# Patient Record
Sex: Female | Born: 1980 | Race: Black or African American | Hispanic: No | Marital: Married | Smoking: Current every day smoker
Health system: Southern US, Community
[De-identification: ages and names within clinical notes are randomized; demographics above are authoritative.]

## PROBLEM LIST (undated history)

## (undated) HISTORY — PX: DILATION AND CURETTAGE OF UTERUS: SHX78

---

## 2017-08-09 ENCOUNTER — Encounter (HOSPITAL_BASED_OUTPATIENT_CLINIC_OR_DEPARTMENT_OTHER): Payer: Self-pay | Admitting: *Deleted

## 2017-08-09 ENCOUNTER — Other Ambulatory Visit: Payer: Self-pay

## 2017-08-09 ENCOUNTER — Emergency Department (HOSPITAL_BASED_OUTPATIENT_CLINIC_OR_DEPARTMENT_OTHER)
Admission: EM | Admit: 2017-08-09 | Discharge: 2017-08-09 | Disposition: A | Discharge status: Home / Self Care | Payer: Self-pay | Attending: Emergency Medicine | Admitting: Emergency Medicine

## 2017-08-09 DIAGNOSIS — Z7982 Long term (current) use of aspirin: Secondary | ICD-10-CM | POA: Insufficient documentation

## 2017-08-09 DIAGNOSIS — K029 Dental caries, unspecified: Secondary | ICD-10-CM | POA: Insufficient documentation

## 2017-08-09 DIAGNOSIS — F17210 Nicotine dependence, cigarettes, uncomplicated: Secondary | ICD-10-CM | POA: Insufficient documentation

## 2017-08-09 DIAGNOSIS — K0889 Other specified disorders of teeth and supporting structures: Secondary | ICD-10-CM

## 2017-08-09 MED ORDER — PENICILLIN V POTASSIUM 500 MG PO TABS: 500.0000 mg | ORAL_TABLET | Freq: Four times a day (QID) | ORAL | 0 refills | Status: DC

## 2017-08-09 MED ORDER — HYDROCODONE-ACETAMINOPHEN 5-325 MG PO TABS: 1.0000 | ORAL_TABLET | Freq: Four times a day (QID) | ORAL | 0 refills | Status: DC | PRN

## 2017-08-09 MED ORDER — PENICILLIN V POTASSIUM 250 MG PO TABS
500.0000 mg | ORAL_TABLET | Freq: Once | ORAL | Status: AC
  Administered 2017-08-09: 500 mg via ORAL
  Filled 2017-08-09: qty 2

## 2017-08-09 MED ORDER — HYDROMORPHONE HCL 1 MG/ML IJ SOLN
2.0000 mg | Freq: Once | INTRAMUSCULAR | Status: AC
  Administered 2017-08-09: 2 mg via INTRAMUSCULAR
  Filled 2017-08-09: qty 2

## 2017-08-09 NOTE — ED Provider Notes (Signed)
[facility] Provider Note: Lowella Dell, MD, FACEP  CSN: [id number] MRN: [id number] ARRIVAL: 08/09/17 at 0018 ROOM: MH04/[facility]04   CHIEF COMPLAINT  Dental Pain   HISTORY OF PRESENT ILLNESS  08/09/17 1:39 AM Mary Kidd is a 37 y.o. female with a 3-day history of pain in her left upper molar region.  She associates it primarily with her left upper third molar but also with her left upper first molar and missing or severely carious second molar.  Pain is worse with eating or drinking.  She refuses a dental block due to fear of needles.    History reviewed. No pertinent past medical history.  Past Surgical History:  Procedure Laterality Date  . DILATION AND CURETTAGE OF UTERUS      History reviewed. No pertinent family history.  Social History   Tobacco Use  . Smoking status: Current Every Day Smoker    Packs/day: 0.50    Types: Cigarettes  . Smokeless tobacco: Never Used  Substance Use Topics  . Alcohol use: Yes    Comment: soc  . Drug use: Never    Prior to Admission medications   Medication Sig Start Date End Date Taking? Authorizing Provider  Aspirin-Acetaminophen-Caffeine (GOODY HEADACHE PO) Take by mouth.   Yes [provider]  traMADol (ULTRAM) 50 MG tablet Take 100 mg by mouth every 6 (six) hours as needed.   Yes [provider]    Allergies Patient has no known allergies.   REVIEW OF SYSTEMS  Negative except as noted here or in the History of Present Illness.   PHYSICAL EXAMINATION  Initial Vital Signs Blood pressure (!) 153/93, pulse 66, temperature 98.7 F (37.1 C), resp. rate 16, height  (1.397 m), weight 70.3 kg (155 lb), last menstrual period 08/04/2017, SpO2 99 %.  Examination General: Well-developed, well-nourished female in no acute distress; appearance consistent with age of record HENT: normocephalic; atraumatic; multiple missing and carious teeth: Tenderness of left upper molars, notably the third molar  with missing or severely carious second molar Eyes: pupils equal, round and reactive to light; extraocular muscles intact Neck: supple; no lymphadenopathy Heart: regular rate and rhythm Lungs: clear to auscultation bilaterally Abdomen: soft; nondistended; nontender Extremities: No deformity; full range of motion; pulses normal Neurologic: Awake, alert and oriented; motor function intact in all extremities and symmetric; no facial droop Skin: Warm and dry Psychiatric: Normal mood and affect   RESULTS  Summary of this visit's results, reviewed by myself:   EKG Interpretation  Date/Time:    Ventricular Rate:    PR Interval:    QRS Duration:   QT Interval:    QTC Calculation:   R Axis:     Text Interpretation:        Laboratory Studies: No results found for this or any previous visit (from the past 24 hour(s)). Imaging Studies: No results found.  ED COURSE and MDM  Nursing notes and initial vitals signs, including pulse oximetry, reviewed.  Vitals:   08/09/17 0021 08/09/17 0022 08/09/17 0024  BP:   (!) 153/93  Pulse: 66    Resp: 16    Temp: 98.7 F (37.1 C)    SpO2: 99%    Weight:  70.3 kg (155 lb)   Height:   (1.397 m)    We will treat patient's pain and place her on penicillin.  We will refer to the dentist on call for definitive treatment.  Consultation with the [facility] state controlled substances database reveals  the patient has received 2 prescriptions for tramadol in the last 2 years.   PROCEDURES    ED DIAGNOSES     ICD-10-CM   1. Tooth pain K08.89   2. Dental caries K02.9        Giordano Getman, MD 08/09/17 [phone]

## 2017-08-09 NOTE — ED Triage Notes (Signed)
Pt c/o dental pain x 3 days.

## 2017-09-23 ENCOUNTER — Emergency Department (HOSPITAL_BASED_OUTPATIENT_CLINIC_OR_DEPARTMENT_OTHER)
Admission: EM | Admit: 2017-09-23 | Discharge: 2017-09-23 | Disposition: A | Discharge status: Home / Self Care | Payer: Self-pay | Attending: Emergency Medicine | Admitting: Emergency Medicine

## 2017-09-23 ENCOUNTER — Other Ambulatory Visit: Payer: Self-pay

## 2017-09-23 DIAGNOSIS — F17210 Nicotine dependence, cigarettes, uncomplicated: Secondary | ICD-10-CM | POA: Insufficient documentation

## 2017-09-23 DIAGNOSIS — K0889 Other specified disorders of teeth and supporting structures: Secondary | ICD-10-CM | POA: Insufficient documentation

## 2017-09-23 MED ORDER — OXYCODONE-ACETAMINOPHEN 5-325 MG PO TABS: 1.0000 | ORAL_TABLET | Freq: Four times a day (QID) | ORAL | 0 refills | Status: DC | PRN

## 2017-09-23 MED ORDER — PENICILLIN V POTASSIUM 500 MG PO TABS: 500.0000 mg | ORAL_TABLET | Freq: Four times a day (QID) | ORAL | 0 refills | Status: AC

## 2017-09-23 MED ORDER — NAPROXEN 250 MG PO TABS
500.0000 mg | ORAL_TABLET | Freq: Once | ORAL | Status: AC
  Administered 2017-09-23: 500 mg via ORAL
  Filled 2017-09-23: qty 2

## 2017-09-23 MED ORDER — OXYCODONE-ACETAMINOPHEN 5-325 MG PO TABS
1.0000 | ORAL_TABLET | Freq: Once | ORAL | Status: AC
  Administered 2017-09-23: 1 via ORAL
  Filled 2017-09-23: qty 1

## 2017-09-23 NOTE — ED Provider Notes (Signed)
[facility] EMERGENCY DEPARTMENT Provider Note   CSN: [id number] Arrival date & time: 09/23/17  2130     History   Chief Complaint Chief Complaint  Patient presents with  . Dental Pain    HPI Mary Kidd is a 37 y.o. female.  HPI  Mary Kidd is a 37yo female with no significant past medical history who presents to the emergency department for evaluation of dental pain.  Patient reports that she broke her right bottom premolar 2 months ago.  She developed pain over this tooth 4 days ago.  She states that pain is severe, constant and feels throbbing in nature.  Pain radiates up to her ear.  She reports pain is worsened with cold liquids, cold air or chewing on the right side.  She has tried taking Goody's powder and was seen at [facility] ER last night where she was given a Toradol shot.  She states that despite these efforts her pain is not improved.  She does not have a regular dentist.  She reports some facial swelling on the right side.  She denies trismus, trouble breathing, trouble swallowing, neck pain, fever, chills.  No past medical history on file.  There are no active problems to display for this patient.   Past Surgical History:  Procedure Laterality Date  . DILATION AND CURETTAGE OF UTERUS       OB History   None      Home Medications    Prior to Admission medications   Medication Sig Start Date End Date Taking? Authorizing Provider  HYDROcodone-acetaminophen (NORCO) 5-325 MG tablet Take 1 tablet by mouth every 6 (six) hours as needed (for pain). 08/09/17   Molpus, John, MD  penicillin v potassium (VEETID) 500 MG tablet Take 1 tablet (500 mg total) by mouth 4 (four) times daily. 08/09/17   Molpus, John, MD    Family History No family history on file.  Social History Social History   Tobacco Use  . Smoking status: Current Every Day Smoker    Packs/day: 0.50    Types: Cigarettes  . Smokeless tobacco: Never Used  Substance Use Topics    . Alcohol use: Yes    Comment: soc  . Drug use: Never     Allergies   Patient has no known allergies.   Review of Systems Review of Systems  Constitutional: Negative for chills and fever.  HENT: Positive for dental problem and facial swelling. Negative for trouble swallowing.   Respiratory: Negative for shortness of breath.   Neurological: Negative for headaches.     Physical Exam Updated Vital Signs BP 120/80 (BP Location: Right Arm)   Pulse 79   Temp 99.5 F (37.5 C) (Oral)   Resp 18   Ht 4\' 7"  (1.397 m)   Wt 70.8 kg (156 lb)   LMP 09/23/2017   SpO2 99%   BMI 36.26 kg/m   Physical Exam  Constitutional: She appears well-developed and well-nourished. No distress.  Nontoxic-appearing.  HENT:  Head: Normocephalic and atraumatic.  Mouth/Throat:    Dental cavities and poor oral dentition noted. Several missing teeth. Right lower premolar chipped with associated overlying pain. No abscess noted. Midline uvula. No trismus. OP moist and clear. No oropharyngeal erythema or edema. Neck supple with no tenderness. No facial edema.  Eyes: Right eye exhibits no discharge. Left eye exhibits no discharge.  Neck: Normal range of motion. Neck supple.  FROM, supple.  Pulmonary/Chest: Effort normal. No respiratory distress.  Neurological: She is  alert. Coordination normal.  Skin: She is not diaphoretic.  Psychiatric: She has a normal mood and affect. Her behavior is normal.  Nursing note and vitals reviewed.   ED Treatments / Results  Labs (all labs ordered are listed, but only abnormal results are displayed) Labs Reviewed - No data to display  EKG None  Radiology No results found.  Procedures Procedures (including critical care time)  Medications Ordered in ED Medications  oxyCODONE-acetaminophen (PERCOCET/ROXICET) 5-325 MG per tablet 1 tablet (1 tablet Oral Given 09/23/17 2332)  naproxen (NAPROSYN) tablet 500 mg (500 mg Oral Given 09/23/17 2332)     Initial  Impression / Assessment and Plan / ED Course  I have reviewed the triage vital signs and the nursing notes.  Pertinent labs & imaging results that were available during my care of the patient were reviewed by me and considered in my medical decision making (see chart for details).     Patient with toothache over chipped tooth.  No gross abscess.  Exam unconcerning for Ludwig's angina or spread of infection.  Will treat with penicillin, anti-inflammatories medicine and short course of percocet given patient has been seen for this same complaint yesterday and has not had improvement. Urged patient to follow-up with dentist and have provided her dental resource guide with her discharge paperwork. Counseled her on reasons to return to the ED and she agrees and voices understanding.     Final Clinical Impressions(s) / ED Diagnoses   Final diagnoses:  Pain, dental    ED Discharge Orders        Ordered    penicillin v potassium (VEETID) 500 MG tablet  4 times daily     09/23/17 2337    oxyCODONE-acetaminophen (PERCOCET/ROXICET) 5-325 MG tablet  Every 6 hours PRN     09/23/17 2337       Kellie Shropshire, PA-C 09/23/17 2352    Loren Racer, MD 09/25/17 2311

## 2017-09-23 NOTE — ED Triage Notes (Addendum)
Right sided dental pain with swelling x 4 days.  [facility] given a toradol shot 'that didn't do no good'

## 2017-09-23 NOTE — ED Notes (Signed)
States that she has a rx for antibiotics given to her last night-states that she hasnt been able to get it filled because of her pain

## 2017-09-23 NOTE — Discharge Instructions (Signed)
Use the dental resource guide attached to contact a dentist to have your tooth pain evaluated and managed.   Take 800mg  ibuprofen every 6hrs. I have also written you a prescription for penicillin which is an antibiotic. Please take this four times a day for a week until all antibiotic is completely gone. You have a prescription for oxycodone. This medicine can make you drowsy so please do not drive or work while taking it.   Return to the ER if you have any new or worsening symptoms like trouble opening your mouth, fever, neck swelling or trouble moving your neck, trouble swallowing or trouble breathing.

## 2017-09-23 NOTE — ED Notes (Signed)
Pt reports she was given bactrim and wants penicillin.

## 2017-11-14 ENCOUNTER — Other Ambulatory Visit: Payer: Self-pay

## 2017-11-14 ENCOUNTER — Encounter (HOSPITAL_BASED_OUTPATIENT_CLINIC_OR_DEPARTMENT_OTHER): Payer: Self-pay | Admitting: *Deleted

## 2017-11-14 ENCOUNTER — Emergency Department (HOSPITAL_BASED_OUTPATIENT_CLINIC_OR_DEPARTMENT_OTHER)
Admission: EM | Admit: 2017-11-14 | Discharge: 2017-11-14 | Disposition: A | Discharge status: Home / Self Care | Payer: Self-pay | Attending: Emergency Medicine | Admitting: Emergency Medicine

## 2017-11-14 DIAGNOSIS — F17210 Nicotine dependence, cigarettes, uncomplicated: Secondary | ICD-10-CM | POA: Insufficient documentation

## 2017-11-14 DIAGNOSIS — K0889 Other specified disorders of teeth and supporting structures: Secondary | ICD-10-CM | POA: Insufficient documentation

## 2017-11-14 MED ORDER — KETOROLAC TROMETHAMINE 15 MG/ML IJ SOLN
15.0000 mg | Freq: Once | INTRAMUSCULAR | Status: AC
  Administered 2017-11-14: 15 mg via INTRAMUSCULAR
  Filled 2017-11-14: qty 1

## 2017-11-14 MED ORDER — PENICILLIN V POTASSIUM 500 MG PO TABS: 500.0000 mg | ORAL_TABLET | Freq: Four times a day (QID) | ORAL | 0 refills | Status: AC

## 2017-11-14 MED ORDER — NAPROXEN 500 MG PO TABS: 500.0000 mg | ORAL_TABLET | Freq: Two times a day (BID) | ORAL | 0 refills | Status: DC

## 2017-11-14 MED ORDER — LIDOCAINE VISCOUS HCL 2 % MT SOLN: 15.0000 mL | OROMUCOSAL | 0 refills | Status: DC | PRN

## 2017-11-14 NOTE — ED Provider Notes (Signed)
[facility] EMERGENCY DEPARTMENT Provider Note   CSN: [id number] Arrival date & time: 11/14/17  2130     History   Chief Complaint Chief Complaint  Patient presents with  . Dental Pain    HPI Mary Kidd is a 37 y.o. female presenting for evaluation of dental pain.  Patient states that she started to develop pain of her right upper and right lower tooth yesterday.  She reports she is taking Goody powders without improvement of her symptoms.  She had 2 leftover penicillin pills, she took this morning without improvement.  She has not taken anything else for her symptoms.  She denies trauma or injury.  Denies fevers, chills, difficulty breathing, difficulty swallowing.  She has an appointment with the dentist for October 1.  He has had issues with these teeth before.  She reports her pain is usually well controlled with a Toradol shot and antibiotics at home.  She denies any medical problems, states she takes no medications daily. Patient states she has not been on antibiotics recently other than the 2 pills this morning.  HPI  History reviewed. No pertinent past medical history.  There are no active problems to display for this patient.   Past Surgical History:  Procedure Laterality Date  . DILATION AND CURETTAGE OF UTERUS       OB History   None      Home Medications    Prior to Admission medications   Medication Sig Start Date End Date Taking? Authorizing Provider  lidocaine (XYLOCAINE) 2 % solution Use as directed 15 mLs in the mouth or throat as needed for mouth pain. 11/14/17   Jaiyla Granados, PA-C  naproxen (NAPROSYN) 500 MG tablet Take 1 tablet (500 mg total) by mouth 2 (two) times daily with a meal. 11/14/17   Khoa Opdahl, PA-C  penicillin v potassium (VEETID) 500 MG tablet Take 1 tablet (500 mg total) by mouth 4 (four) times daily for 7 days. 11/14/17 11/21/17  Sully Dyment, PA-C    Family History History reviewed. No pertinent family  history.  Social History Social History   Tobacco Use  . Smoking status: Current Every Day Smoker    Packs/day: 0.50    Types: Cigarettes  . Smokeless tobacco: Never Used  Substance Use Topics  . Alcohol use: Yes    Comment: soc  . Drug use: Never     Allergies   Patient has no known allergies.   Review of Systems Review of Systems  Constitutional: Negative for fever.  HENT: Positive for dental problem.      Physical Exam Updated Vital Signs BP (!) 137/98   Pulse 85   Temp 98.5 F (36.9 C) (Oral)   Resp 16   Ht 4\' 8"  (1.422 m)   Wt 68 kg   LMP 11/10/2017   BMI 33.63 kg/m   Physical Exam  Constitutional: She is oriented to person, place, and time. She appears well-developed and well-nourished. No distress.  HENT:  Head: Normocephalic and atraumatic.  Mouth/Throat: Uvula is midline. No trismus in the jaw. Abnormal dentition. Dental caries present.    Cracked right lower tooth with surrounding gum erythema and edema.  Tenderness of this tooth.  No obvious deformity of the right upper tooth.  Overall poor dentition.  OP clear without tonsillar swelling or exudate.  Uvula midline with equal palate rise.  No facial swelling.  No trismus.  Handling secretions easily.  Eyes: EOM are normal.  Neck: Normal range of motion.  Pulmonary/Chest: Effort normal.  Abdominal: She exhibits no distension.  Musculoskeletal: Normal range of motion.  Neurological: She is alert and oriented to person, place, and time.  Skin: Skin is warm. No rash noted.  Psychiatric: She has a normal mood and affect.  Nursing note and vitals reviewed.    ED Treatments / Results  Labs (all labs ordered are listed, but only abnormal results are displayed) Labs Reviewed - No data to display  EKG None  Radiology No results found.  Procedures Procedures (including critical care time)  Medications Ordered in ED Medications  ketorolac (TORADOL) 15 MG/ML injection 15 mg (15 mg  Intramuscular Given 11/14/17 2216)     Initial Impression / Assessment and Plan / ED Course  I have reviewed the triage vital signs and the nursing notes.  Pertinent labs & imaging results that were available during my care of the patient were reviewed by me and considered in my medical decision making (see chart for details).     Patient presenting for evaluation of dental pain.  Physical exam reassuring, she is afebrile not tachycardic.  Appears nontoxic.  No obvious dental abscess.  No sign of Ludwig's.  Tenderness of the right lower tooth, which is cracked with surrounding gum erythema and inflammation.  No obvious abnormality noted of the right upper tooth.  Will give Toradol shot, treat with NSAIDs, antibiotics, and viscous lidocaine.  Patient encouraged to follow-up with her dentist.  At this time, patient appears safe for discharge.  Return precautions given.  Patient states she understands and agrees to plan.  Final Clinical Impressions(s) / ED Diagnoses   Final diagnoses:  Pain, dental    ED Discharge Orders         Ordered    naproxen (NAPROSYN) 500 MG tablet  2 times daily with meals     11/14/17 2213    penicillin v potassium (VEETID) 500 MG tablet  4 times daily     11/14/17 2213    lidocaine (XYLOCAINE) 2 % solution  As needed     11/14/17 2213           Kristjan Derner, PA-C 11/14/17 2221    Gwyneth Sprout, MD 11/14/17 2336

## 2017-11-14 NOTE — ED Triage Notes (Signed)
Pt c/o dental pain x 4 days 

## 2017-11-14 NOTE — Discharge Instructions (Signed)
Take antibiotics as prescribed.  Take the entire course, even if your symptoms improve. Take naproxen 2 times a day with meals.  Do not take other anti-inflammatories at the same time (Advil, Motrin, ibuprofen, Aleve). You may supplement with Tylenol if you need further pain control. Use viscous lidocaine as needed for further pain.  Follow-up with a dentist for further evaluation and management of your teeth. Return to the emergency room with any new, worsening, or concerning symptoms.

## 2018-11-15 ENCOUNTER — Other Ambulatory Visit: Payer: Self-pay

## 2018-11-15 ENCOUNTER — Emergency Department (HOSPITAL_BASED_OUTPATIENT_CLINIC_OR_DEPARTMENT_OTHER): Payer: Self-pay

## 2018-11-15 ENCOUNTER — Encounter (HOSPITAL_BASED_OUTPATIENT_CLINIC_OR_DEPARTMENT_OTHER): Payer: Self-pay | Admitting: *Deleted

## 2018-11-15 ENCOUNTER — Emergency Department (HOSPITAL_BASED_OUTPATIENT_CLINIC_OR_DEPARTMENT_OTHER)
Admission: EM | Admit: 2018-11-15 | Discharge: 2018-11-15 | Disposition: A | Discharge status: Home / Self Care | Payer: Self-pay | Attending: Emergency Medicine | Admitting: Emergency Medicine

## 2018-11-15 DIAGNOSIS — Z880 Allergy status to penicillin: Secondary | ICD-10-CM | POA: Insufficient documentation

## 2018-11-15 DIAGNOSIS — F17210 Nicotine dependence, cigarettes, uncomplicated: Secondary | ICD-10-CM | POA: Insufficient documentation

## 2018-11-15 DIAGNOSIS — M2391 Unspecified internal derangement of right knee: Secondary | ICD-10-CM | POA: Insufficient documentation

## 2018-11-15 MED ORDER — HYDROCODONE-ACETAMINOPHEN 5-325 MG PO TABS: 1.0000 | ORAL_TABLET | Freq: Four times a day (QID) | ORAL | 0 refills | Status: DC | PRN

## 2018-11-15 MED ORDER — NAPROXEN 250 MG PO TABS
500.0000 mg | ORAL_TABLET | Freq: Once | ORAL | Status: AC
  Administered 2018-11-15: 500 mg via ORAL
  Filled 2018-11-15: qty 2

## 2018-11-15 MED ORDER — NAPROXEN 375 MG PO TABS: ORAL_TABLET | ORAL | 0 refills | Status: DC

## 2018-11-15 NOTE — ED Notes (Signed)
Patient transported to X-ray 
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
## 2018-11-15 NOTE — ED Triage Notes (Signed)
Pt c/o right knee injury x 6 hrs ago

## 2019-09-12 ENCOUNTER — Emergency Department (HOSPITAL_BASED_OUTPATIENT_CLINIC_OR_DEPARTMENT_OTHER)
Admission: EM | Admit: 2019-09-12 | Discharge: 2019-09-12 | Disposition: A | Discharge status: Home / Self Care | Payer: Self-pay | Attending: Emergency Medicine | Admitting: Emergency Medicine

## 2019-09-12 ENCOUNTER — Emergency Department (HOSPITAL_BASED_OUTPATIENT_CLINIC_OR_DEPARTMENT_OTHER): Payer: Self-pay

## 2019-09-12 ENCOUNTER — Other Ambulatory Visit: Payer: Self-pay

## 2019-09-12 ENCOUNTER — Encounter (HOSPITAL_BASED_OUTPATIENT_CLINIC_OR_DEPARTMENT_OTHER): Payer: Self-pay

## 2019-09-12 DIAGNOSIS — J209 Acute bronchitis, unspecified: Secondary | ICD-10-CM | POA: Insufficient documentation

## 2019-09-12 DIAGNOSIS — R059 Cough, unspecified: Secondary | ICD-10-CM

## 2019-09-12 DIAGNOSIS — F17210 Nicotine dependence, cigarettes, uncomplicated: Secondary | ICD-10-CM | POA: Insufficient documentation

## 2019-09-12 DIAGNOSIS — J40 Bronchitis, not specified as acute or chronic: Secondary | ICD-10-CM

## 2019-09-12 MED ORDER — ALBUTEROL SULFATE HFA 108 (90 BASE) MCG/ACT IN AERS
4.0000 | INHALATION_SPRAY | Freq: Once | RESPIRATORY_TRACT | Status: AC
  Administered 2019-09-12: 4 via RESPIRATORY_TRACT
  Filled 2019-09-12: qty 6.7

## 2019-09-12 MED ORDER — PREDNISONE 20 MG PO TABS: 40.0000 mg | ORAL_TABLET | Freq: Every day | ORAL | 0 refills | Status: AC

## 2019-09-12 NOTE — ED Triage Notes (Signed)
Pt c/o prod cough, SOB, chest tightness x 8 days-pt states feels like bronchitis with hx of same since childhood-NAD-steady gait

## 2019-09-12 NOTE — ED Provider Notes (Signed)
[facility] EMERGENCY DEPARTMENT Provider Note   CSN: [id number] Arrival date & time: 09/12/19  1325     History Chief Complaint  Patient presents with  . Cough    Mary Kidd is a 39 y.o. female.  39 y.o female with a PMH of bronchitis presents to the ED with a chief complaint of dry cough x 8 days. Patient reports she's had the same symptoms when she had bronchitis such as a cough, chest tightness, constant coughing fits.  She feels that she cannot take a deep breath has her chest feels tight and sore from all the coughing.  She has tried TheraFlu, NyQuil, halls without any improvement in her symptoms.  She is a current tobacco user but reports she quit smoking 3 days ago.  No alleviating factors.  Denies any fever, chills, orthopnea, sick contacts.  No current vaccination status.   The history is provided by the patient.  Cough Cough characteristics:  Dry Sputum characteristics:  Nondescript Severity:  Moderate Onset quality:  Sudden Duration:  8 days Timing:  Constant Progression:  Worsening Chronicity:  New Smoker: yes   Associated symptoms: shortness of breath and sore throat   Associated symptoms: no chest pain and no chills        History reviewed. No pertinent past medical history.  There are no problems to display for this patient.   Past Surgical History:  Procedure Laterality Date  . DILATION AND CURETTAGE OF UTERUS       OB History   No obstetric history on file.     No family history on file.  Social History   Tobacco Use  . Smoking status: Current Every Day Smoker    Packs/day: 0.50    Types: Cigarettes  . Smokeless tobacco: Never Used  . Tobacco comment: states quit 09/09/2019  Vaping Use  . Vaping Use: Never used  Substance Use Topics  . Alcohol use: Not Currently  . Drug use: Never    Home Medications Prior to Admission medications   Medication Sig Start Date End Date Taking? Authorizing Provider  naproxen (NAPROSYN)  375 MG tablet Take 1 tablet twice daily for pain. 11/15/18   Molpus, John, MD  predniSONE (DELTASONE) 20 MG tablet Take 2 tablets (40 mg total) by mouth daily for 5 days. 09/12/19 09/17/19  Claude Manges, PA-C    Allergies    Penicillins  Review of Systems   Review of Systems  Constitutional: Negative for chills.  HENT: Positive for sore throat.   Respiratory: Positive for cough and shortness of breath.   Cardiovascular: Negative for chest pain, palpitations and leg swelling.    Physical Exam Updated Vital Signs BP (!) 139/103 (BP Location: Left Arm)   Pulse 89   Temp 98.9 F (37.2 C) (Oral)   Resp 20   Ht 4\' 7"  (1.397 m)   Wt 76.7 kg   LMP 09/05/2019   SpO2 100%   BMI 39.28 kg/m   Physical Exam Vitals and nursing note reviewed.  Constitutional:      Appearance: Normal appearance. She is ill-appearing.  HENT:     Head: Normocephalic and atraumatic.     Nose: Nose normal.     Mouth/Throat:     Mouth: Mucous membranes are moist.     Pharynx: No oropharyngeal exudate.  Eyes:     Pupils: Pupils are equal, round, and reactive to light.  Cardiovascular:     Rate and Rhythm: Normal rate.  Pulmonary:  Effort: Pulmonary effort is normal.     Breath sounds: Examination of the right-middle field reveals wheezing. Examination of the right-lower field reveals wheezing. Examination of the left-lower field reveals rhonchi. Wheezing and rhonchi present.     Comments: Rhonchi noted on left lower lung field.  Abdominal:     General: Abdomen is flat.     Tenderness: There is no abdominal tenderness. There is no left CVA tenderness.  Musculoskeletal:     Cervical back: Normal range of motion and neck supple.  Skin:    General: Skin is warm and dry.  Neurological:     Mental Status: She is alert and oriented to person, place, and time.     ED Results / Procedures / Treatments   Labs (all labs ordered are listed, but only abnormal results are displayed) Labs Reviewed - No data to  display  EKG None  Radiology DG Chest [facility] 1 View  Result Date: 09/12/2019 CLINICAL DATA:  Productive cough, shortness of breath. EXAM: PORTABLE CHEST 1 VIEW COMPARISON:  March 31, 2018. FINDINGS: The heart size and mediastinal contours are within normal limits. Both lungs are clear. No pneumothorax or pleural effusion is noted. The visualized skeletal structures are unremarkable. IMPRESSION: No active disease. Electronically Signed   By: Lupita Raider M.D.   On: 09/12/2019 14:49    Procedures Procedures (including critical care time)  Medications Ordered in ED Medications  albuterol (VENTOLIN HFA) 108 (90 Base) MCG/ACT inhaler 4 puff (4 puffs Inhalation Given 09/12/19 1448)    ED Course  I have reviewed the triage vital signs and the nursing notes.  Pertinent labs & imaging results that were available during my care of the patient were reviewed by me and considered in my medical decision making (see chart for details).    MDM Rules/Calculators/A&P   Patient with no pertinent past medical history presents to the ED with a chief complaint of cough for the past 8 days.  Patient reports a previous history of bronchitis, states this feels just like it with a dry constant cough, history over-the-counter medication without improvement.  No orthopnea, no chest pain, no prior history of CAD or CHF.  Blood pressure slightly elevated on today's visit.  She reports she quit using tobacco about 3 days ago but has an ongoing history of smoking.  During my evaluation lungs have unkind present to the left lower lung fields.  There is slight wheezing noted to the left lobes as well.  She is overall ill-appearing, oropharynx is clear without any exudate, chest is tender to palpation, described as soreness. Will obtain chest xray to further r/o pneumonia.   Xray of her chest without any signs of consolidation, no pleural effusion or other abnormalities.  Discussed results of her x-ray, she will go  home on a short course of steroids to help with symptoms that she has had this ongoing for the past 8 days.  No signs of pneumonia on her x-ray.  She also go home with albuterol inhaler.  Return precautions discussed at length.  Patient stable for discharge.   Portions of this note were generated with Scientist, clinical (histocompatibility and immunogenetics). Dictation errors may occur despite best attempts at proofreading.  Final Clinical Impression(s) / ED Diagnoses Final diagnoses:  Cough  Bronchitis    Rx / DC Orders ED Discharge Orders         Ordered    predniSONE (DELTASONE) 20 MG tablet  Daily     Discontinue  Reprint  09/12/19 1500           Claude Manges, PA-C 09/12/19 1502    Terrilee Files, MD 09/12/19 2042

## 2019-09-12 NOTE — Discharge Instructions (Addendum)
Your chest xray today was normal.I have prescribed a short course of steroids to help with your symptoms. Please take 2 tablet once a day for the next 5 days.  Please use inhaler which was provided to you in a prescription form.  Follow-up with your primary care physician as needed.

## 2019-12-05 ENCOUNTER — Emergency Department (HOSPITAL_BASED_OUTPATIENT_CLINIC_OR_DEPARTMENT_OTHER)
Admission: EM | Admit: 2019-12-05 | Discharge: 2019-12-06 | Disposition: A | Discharge status: Home / Self Care | Payer: HRSA Program | Attending: Emergency Medicine | Admitting: Emergency Medicine

## 2019-12-05 ENCOUNTER — Encounter (HOSPITAL_BASED_OUTPATIENT_CLINIC_OR_DEPARTMENT_OTHER): Payer: Self-pay

## 2019-12-05 ENCOUNTER — Other Ambulatory Visit: Payer: Self-pay

## 2019-12-05 DIAGNOSIS — R519 Headache, unspecified: Secondary | ICD-10-CM | POA: Diagnosis present

## 2019-12-05 DIAGNOSIS — F17210 Nicotine dependence, cigarettes, uncomplicated: Secondary | ICD-10-CM | POA: Insufficient documentation

## 2019-12-05 DIAGNOSIS — U071 COVID-19: Secondary | ICD-10-CM | POA: Diagnosis not present

## 2019-12-05 LAB — SARS CORONAVIRUS 2 BY RT PCR (HOSPITAL ORDER, PERFORMED IN ~~LOC~~ HOSPITAL LAB): SARS Coronavirus 2: POSITIVE — AB

## 2019-12-05 NOTE — ED Notes (Signed)
Here for possible covid infection, feels like she has a sinus HA, states the night before had a low grade fever, with cough and having chills. Took NyQuill, HA is more around eyes and nasal area. Denies N/V, no further coughing or chest discomfort, states she has noticed she has a lost in taste and smell. Has not had any Covid Vaccines

## 2019-12-05 NOTE — Discharge Instructions (Addendum)
Person Under Monitoring Name: Mary Kidd  Location: [address] [place] [address]   Infection Prevention Recommendations for Individuals Confirmed to have, or Being Evaluated for, 2019 Novel Coronavirus (COVID-19) Infection Who Receive Care at Home  Individuals who are confirmed to have, or are being evaluated for, COVID-19 should follow the prevention steps below until a healthcare provider or local or state health department says they can return to normal activities.  Stay home except to get medical care You should restrict activities outside your home, except for getting medical care. Do not go to work, school, or public areas, and do not use public transportation or taxis.  Call ahead before visiting your doctor Before your medical appointment, call the healthcare provider and tell them that you have, or are being evaluated for, COVID-19 infection. This will help the healthcare provider's office take steps to keep other people from getting infected. Ask your healthcare provider to call the local or state health department.  Monitor your symptoms Seek prompt medical attention if your illness is worsening (e.g., difficulty breathing). Before going to your medical appointment, call the healthcare provider and tell them that you have, or are being evaluated for, COVID-19 infection. Ask your healthcare provider to call the local or state health department.  Wear a facemask You should wear a facemask that covers your nose and mouth when you are in the same room with other people and when you visit a healthcare provider. People who live with or visit you should also wear a facemask while they are in the same room with you.  Separate yourself from other people in your home As much as possible, you should stay in a different room from other people in your home. Also, you should use a separate bathroom, if available.  Avoid sharing household items You should not  share dishes, drinking glasses, cups, eating utensils, towels, bedding, or other items with other people in your home. After using these items, you should wash them thoroughly with soap and water.  Cover your coughs and sneezes Cover your mouth and nose with a tissue when you cough or sneeze, or you can cough or sneeze into your sleeve. Throw used tissues in a lined trash can, and immediately wash your hands with soap and water for at least 20 seconds or use an alcohol-based hand rub.  Wash your [employer] your hands often and thoroughly with soap and water for at least 20 seconds. You can use an alcohol-based hand sanitizer if soap and water are not available and if your hands are not visibly dirty. Avoid touching your eyes, nose, and mouth with unwashed hands.   Prevention Steps for Caregivers and Household Members of Individuals Confirmed to have, or Being Evaluated for, COVID-19 Infection Being Cared for in the Home  If you live with, or provide care at home for, a person confirmed to have, or being evaluated for, COVID-19 infection please follow these guidelines to prevent infection:  Follow healthcare provider's instructions Make sure that you understand and can help the patient follow any healthcare provider instructions for all care.  Provide for the patient's basic needs You should help the patient with basic needs in the home and provide support for getting groceries, prescriptions, and other personal needs.  Monitor the patient's symptoms If they are getting sicker, call his or her medical provider and tell them that the patient has, or is being evaluated for, COVID-19 infection. This will help the healthcare  provider's office take steps to keep other people from getting infected. Ask the healthcare provider to call the local or state health department.  Limit the number of people who have contact with the patient If possible, have only one caregiver for the  patient. Other household members should stay in another home or place of residence. If this is not possible, they should stay in another room, or be separated from the patient as much as possible. Use a separate bathroom, if available. Restrict visitors who do not have an essential need to be in the home.  Keep older adults, very young children, and other sick people away from the patient Keep older adults, very young children, and those who have compromised immune systems or chronic health conditions away from the patient. This includes people with chronic heart, lung, or kidney conditions, diabetes, and cancer.  Ensure good ventilation Make sure that shared spaces in the home have good air flow, such as from an air conditioner or an opened window, weather permitting.  Wash your hands often Wash your hands often and thoroughly with soap and water for at least 20 seconds. You can use an alcohol based hand sanitizer if soap and water are not available and if your hands are not visibly dirty. Avoid touching your eyes, nose, and mouth with unwashed hands. Use disposable paper towels to dry your hands. If not available, use dedicated cloth towels and replace them when they become wet.  Wear a facemask and gloves Wear a disposable facemask at all times in the room and gloves when you touch or have contact with the patient's blood, body fluids, and/or secretions or excretions, such as sweat, saliva, sputum, nasal mucus, vomit, urine, or feces.  Ensure the mask fits over your nose and mouth tightly, and do not touch it during use. Throw out disposable facemasks and gloves after using them. Do not reuse. Wash your hands immediately after removing your facemask and gloves. If your personal clothing becomes contaminated, carefully remove clothing and launder. Wash your hands after handling contaminated clothing. Place all used disposable facemasks, gloves, and other waste in a lined container before  disposing them with other household waste. Remove gloves and wash your hands immediately after handling these items.  Do not share dishes, glasses, or other household items with the patient Avoid sharing household items. You should not share dishes, drinking glasses, cups, eating utensils, towels, bedding, or other items with a patient who is confirmed to have, or being evaluated for, COVID-19 infection. After the person uses these items, you should wash them thoroughly with soap and water.  Wash laundry thoroughly Immediately remove and wash clothes or bedding that have blood, body fluids, and/or secretions or excretions, such as sweat, saliva, sputum, nasal mucus, vomit, urine, or feces, on them. Wear gloves when handling laundry from the patient. Read and follow directions on labels of laundry or clothing items and detergent. In general, wash and dry with the warmest temperatures recommended on the label.  Clean all areas the individual has used often Clean all touchable surfaces, such as counters, tabletops, doorknobs, bathroom fixtures, toilets, phones, keyboards, tablets, and bedside tables, every day. Also, clean any surfaces that may have blood, body fluids, and/or secretions or excretions on them. Wear gloves when cleaning surfaces the patient has come in contact with. Use a diluted bleach solution (e.g., dilute bleach with 1 part bleach and 10 parts water) or a household disinfectant with a label that says EPA-registered for coronaviruses. To make  a bleach solution at home, add 1 tablespoon of bleach to 1 quart (4 cups) of water. For a larger supply, add  cup of bleach to 1 gallon (16 cups) of water. Read labels of cleaning products and follow recommendations provided on product labels. Labels contain instructions for safe and effective use of the cleaning product including precautions you should take when applying the product, such as wearing gloves or eye protection and making sure you  have good ventilation during use of the product. Remove gloves and wash hands immediately after cleaning.  Monitor yourself for signs and symptoms of illness Caregivers and household members are considered close contacts, should monitor their health, and will be asked to limit movement outside of the home to the extent possible. Follow the monitoring steps for close contacts listed on the symptom monitoring form.   ? If you have additional questions, contact your local health department or call the epidemiologist on call at [phone] (available 24/7). ? This guidance is subject to change. For the most up-to-date guidance from [facility], please refer to their website: [email or web address]

## 2019-12-05 NOTE — ED Provider Notes (Signed)
[facility] EMERGENCY DEPARTMENT Provider Note   CSN: [id number] Arrival date & time: 12/05/19  2221     History Chief Complaint  Patient presents with  . Headache    Mary Kidd is a 39 y.o. female.  The history is provided by the patient.  Headache Pain location:  Frontal Quality:  Dull Radiates to:  Does not radiate Onset quality:  Gradual Timing:  Constant Progression:  Unchanged Chronicity:  New Similar to prior headaches: yes   Context: not activity and not defecating   Relieved by:  Nothing Worsened by:  Nothing Ineffective treatments:  None tried Associated symptoms: congestion, cough, fever and URI   Associated symptoms: no abdominal pain, no diarrhea, no eye pain, no near-syncope, no sore throat, no swollen glands and no syncope   Associated symptoms comment:  Loss of taste and smell.    Risk factors: no anger        History reviewed. No pertinent past medical history.  There are no problems to display for this patient.   Past Surgical History:  Procedure Laterality Date  . DILATION AND CURETTAGE OF UTERUS       OB History   No obstetric history on file.     History reviewed. No pertinent family history.  Social History   Tobacco Use  . Smoking status: Current Every Day Smoker    Packs/day: 0.50    Types: Cigarettes  . Smokeless tobacco: Never Used  Vaping Use  . Vaping Use: Never used  Substance Use Topics  . Alcohol use: Not Currently  . Drug use: Never    Home Medications Prior to Admission medications   Medication Sig Start Date End Date Taking? Authorizing Provider  naproxen (NAPROSYN) 375 MG tablet Take 1 tablet twice daily for pain. 11/15/18   Molpus, John, MD    Allergies    Penicillins  Review of Systems   Review of Systems  Constitutional: Positive for fever.  HENT: Positive for congestion. Negative for sore throat.   Eyes: Negative for pain.  Respiratory: Positive for cough.   Cardiovascular: Negative  for chest pain, syncope and near-syncope.  Gastrointestinal: Negative for abdominal pain and diarrhea.  Genitourinary: Negative for difficulty urinating.  Musculoskeletal: Negative for arthralgias.  Skin: Negative for rash.  Neurological: Positive for headaches.  Psychiatric/Behavioral: Negative for agitation.  All other systems reviewed and are negative.   Physical Exam Updated Vital Signs BP 127/89 (BP Location: Left Arm)   Pulse 78   Temp 98.6 F (37 C) (Oral)   Resp 16   Ht 4\' 7"  (1.397 m)   Wt 72.1 kg   LMP 12/05/2019   SpO2 98%   BMI 36.96 kg/m   Physical Exam Vitals and nursing note reviewed.  Constitutional:      General: She is not in acute distress.    Appearance: Normal appearance.  HENT:     Head: Normocephalic and atraumatic.     Nose: Nose normal.  Eyes:     Conjunctiva/sclera: Conjunctivae normal.     Pupils: Pupils are equal, round, and reactive to light.  Cardiovascular:     Rate and Rhythm: Normal rate and regular rhythm.     Pulses: Normal pulses.     Heart sounds: Normal heart sounds.  Pulmonary:     Effort: Pulmonary effort is normal.     Breath sounds: Normal breath sounds.  Abdominal:     General: Abdomen is flat. Bowel sounds are normal.     Palpations: Abdomen  is soft.     Tenderness: There is no abdominal tenderness. There is no guarding or rebound.  Musculoskeletal:        General: Normal range of motion.     Cervical back: Normal range of motion and neck supple.  Skin:    General: Skin is warm and dry.     Capillary Refill: Capillary refill takes less than 2 seconds.  Neurological:     General: No focal deficit present.     Mental Status: She is alert and oriented to person, place, and time.     Deep Tendon Reflexes: Reflexes normal.  Psychiatric:        Mood and Affect: Mood normal.        Behavior: Behavior normal.     ED Results / Procedures / Treatments   Labs (all labs ordered are listed, but only abnormal results are  displayed) Labs Reviewed  SARS CORONAVIRUS 2 BY RT PCR (HOSPITAL ORDER, PERFORMED IN [facility]) - Abnormal; Notable for the following components:      Result Value   SARS Coronavirus 2 POSITIVE (*)    All other components within normal limits    EKG None  Radiology No results found.  Procedures Procedures (including critical care time)  Medications Ordered in ED Medications - No data to display  ED Course  I have reviewed the triage vital signs and the nursing notes.  Pertinent labs & imaging results that were available during my care of the patient were reviewed by me and considered in my medical decision making (see chart for details).    Unvaccinated individual with covid 19.  Strict quarantine precautions given verbally and in print.  Well appearing.  Normal oxygen saturation.    Mary Kidd was evaluated in Emergency Department on 12/05/2019 for the symptoms described in the history of present illness. She was evaluated in the context of the global COVID-19 pandemic, which necessitated consideration that the patient might be at risk for infection with the SARS-CoV-2 virus that causes COVID-19. Institutional protocols and algorithms that pertain to the evaluation of patients at risk for COVID-19 are in a state of rapid change based on information released by regulatory bodies including the CDC and federal and state organizations. These policies and algorithms were followed during the patient's care in the ED.  Final Clinical Impression(s) / ED Diagnoses Final diagnoses:  COVID-19   Return for intractable cough, coughing up blood,fevers >100.4 unrelieved by medication, shortness of breath, intractable vomiting, chest pain, shortness of breath, weakness,numbness, changes in speech, facial asymmetry,abdominal pain, passing out,Inability to tolerate liquids or food, cough, altered mental status or any concerns. No signs of systemic illness or infection. The  patient is nontoxic-appearing on exam and vital signs are within normal limits.   I have reviewed the triage vital signs and the nursing notes. Pertinent labs &imaging results that were available during my care of the patient were reviewed by me and considered in my medical decision making (see chart for details).After history, exam, and medical workup I feel the patient has beenappropriately medically screened and is safe for discharge home. Pertinent diagnoses were discussed with the patient. Patient was given return precautions.   Miku Udall, MD 12/05/19 [phone]

## 2019-12-05 NOTE — ED Triage Notes (Signed)
Pt c/o HA, fever, loss of taste x 3 days-NAD-steady gait

## 2019-12-06 ENCOUNTER — Telehealth (HOSPITAL_COMMUNITY): Payer: Self-pay | Admitting: Adult Health

## 2019-12-06 NOTE — Telephone Encounter (Signed)
Called patient regarding monoclonal antibody treatment for COVID 19 given to those who are at risk for complications and/or hospitalization of the virus.  Patient meets criteria based on: bmi greater than 25.  She is going to think about receiving therapy and will call us if she is interested.    Call back number given: [phone]  Lillard Anes, NP

## 2020-02-25 ENCOUNTER — Other Ambulatory Visit: Payer: Self-pay

## 2020-02-25 ENCOUNTER — Emergency Department (HOSPITAL_BASED_OUTPATIENT_CLINIC_OR_DEPARTMENT_OTHER): Payer: Self-pay

## 2020-02-25 ENCOUNTER — Encounter (HOSPITAL_BASED_OUTPATIENT_CLINIC_OR_DEPARTMENT_OTHER): Payer: Self-pay | Admitting: *Deleted

## 2020-02-25 ENCOUNTER — Emergency Department (HOSPITAL_BASED_OUTPATIENT_CLINIC_OR_DEPARTMENT_OTHER)
Admission: EM | Admit: 2020-02-25 | Discharge: 2020-02-25 | Disposition: A | Discharge status: Home / Self Care | Payer: Self-pay | Attending: Emergency Medicine | Admitting: Emergency Medicine

## 2020-02-25 DIAGNOSIS — F17210 Nicotine dependence, cigarettes, uncomplicated: Secondary | ICD-10-CM | POA: Insufficient documentation

## 2020-02-25 DIAGNOSIS — M79641 Pain in right hand: Secondary | ICD-10-CM | POA: Insufficient documentation

## 2020-02-25 DIAGNOSIS — Z79899 Other long term (current) drug therapy: Secondary | ICD-10-CM | POA: Insufficient documentation

## 2020-02-25 DIAGNOSIS — W208XXA Other cause of strike by thrown, projected or falling object, initial encounter: Secondary | ICD-10-CM | POA: Insufficient documentation

## 2020-02-25 DIAGNOSIS — Y92018 Other place in single-family (private) house as the place of occurrence of the external cause: Secondary | ICD-10-CM | POA: Insufficient documentation

## 2020-02-25 MED ORDER — ACETAMINOPHEN 325 MG PO TABS
650.0000 mg | ORAL_TABLET | Freq: Once | ORAL | Status: AC
  Administered 2020-02-25: 650 mg via ORAL
  Filled 2020-02-25: qty 2

## 2020-02-25 MED ORDER — IBUPROFEN 400 MG PO TABS
600.0000 mg | ORAL_TABLET | Freq: Once | ORAL | Status: AC
  Administered 2020-02-25: 600 mg via ORAL
  Filled 2020-02-25: qty 1

## 2020-02-25 NOTE — Discharge Instructions (Addendum)
The xray did not show any broken bones.   It is likely you have a sprain.  Wear the brace for comfort.  Take tylenol and ibuprofen as we discussed for pain. Take as directed on the bottle. Do not take Goody's Powder.  Follow up with primary care doctor or orthopedics Dr. Jordan Likes if needed.

## 2020-02-25 NOTE — ED Notes (Signed)
Last night patient accidentally caught her 3rd and 4th digits on her right hand hyperflexing them, has been using ice and BC powder to control pain since.

## 2020-02-25 NOTE — ED Triage Notes (Signed)
Last night she was preventing a clothes basket from falling and bent her right middle and ring fingers backward. Here today for pain control and r/o fx.

## 2020-02-25 NOTE — ED Provider Notes (Signed)
[facility] EMERGENCY DEPARTMENT Provider Note   CSN: [id number] Arrival date & time: 02/25/20  1753     History Chief Complaint  Patient presents with  . Hand Pain    Mary Kidd is a 39 y.o. right hand dominant female with noncontributory past medical history.  HPI Patient presents to emergency department today with chief complaint of right hand pain x 1 day. Patient states she was trying to catch a clothes basket as it fell of the lid of the deep freezer. She states the basket falling on her hand caused there right third and fourth fingers to bend backwards. She has had progressively worsening pain located in the those fingers and in her wrist. Pain is worse with movement. She took a goody's powder early this morning with some symptom improvement. She rates pain 9/10 in severity. She denies any fever, chills, numbness, tingling, decreased sensation, swelling, myalgias.    History reviewed. No pertinent past medical history.  There are no problems to display for this patient.   Past Surgical History:  Procedure Laterality Date  . DILATION AND CURETTAGE OF UTERUS       OB History   No obstetric history on file.     No family history on file.  Social History   Tobacco Use  . Smoking status: Current Every Day Smoker    Packs/day: 0.50    Types: Cigarettes  . Smokeless tobacco: Never Used  Vaping Use  . Vaping Use: Never used  Substance Use Topics  . Alcohol use: Not Currently  . Drug use: Never    Home Medications Prior to Admission medications   Medication Sig Start Date End Date Taking? Authorizing Provider  naproxen (NAPROSYN) 375 MG tablet Take 1 tablet twice daily for pain. 11/15/18   Molpus, John, MD    Allergies    Penicillins  Review of Systems   Review of Systems All other systems are reviewed and are negative for acute change except as noted in the HPI.  Physical Exam Updated Vital Signs BP 116/87   Pulse 91   Temp 99.5 F (37.5  C) (Oral)   Resp 16   Ht 4\' 7"  (1.397 m)   Wt 76.2 kg   LMP 02/18/2020   SpO2 99%   BMI 39.05 kg/m   Physical Exam Vitals and nursing note reviewed.  Constitutional:      Appearance: She is well-developed. She is not ill-appearing or toxic-appearing.  HENT:     Head: Normocephalic and atraumatic.     Nose: Nose normal.  Eyes:     General: No scleral icterus.       Right eye: No discharge.        Left eye: No discharge.     Conjunctiva/sclera: Conjunctivae normal.  Neck:     Vascular: No JVD.  Cardiovascular:     Rate and Rhythm: Normal rate and regular rhythm.     Pulses: Normal pulses.          Radial pulses are 2+ on the right side and 2+ on the left side.     Heart sounds: Normal heart sounds.  Pulmonary:     Effort: Pulmonary effort is normal.     Breath sounds: Normal breath sounds.  Abdominal:     General: There is no distension.  Musculoskeletal:        General: Normal range of motion.     Cervical back: Normal range of motion.     Comments: Right hand with  tenderness to palpation of dorsal aspect of base of third and fourth fingers on right hand and lateral aspect of right wrist. No swelling. There is no joint effusion noted. Full ROM without pain. No erythema or warmth overlaying the joint. There is no anatomic snuff box tenderness. Normal sensation and motor function in the median, ulnar, and radial nerve distributions. 2+ radial pulse.  Full ROM of right elbow.  Skin:    General: Skin is warm and dry.  Neurological:     Mental Status: She is oriented to person, place, and time.     GCS: GCS eye subscore is 4. GCS verbal subscore is 5. GCS motor subscore is 6.     Comments: Fluent speech, no facial droop.  Psychiatric:        Behavior: Behavior normal.     ED Results / Procedures / Treatments   Labs (all labs ordered are listed, but only abnormal results are displayed) Labs Reviewed - No data to display  EKG None  Radiology DG Hand Complete  Right  Result Date: 02/25/2020 CLINICAL DATA:  Injury to third and fourth digits last night. EXAM: RIGHT HAND - COMPLETE 3+ VIEW COMPARISON:  None. FINDINGS: There is no evidence of fracture or dislocation. There is no evidence of arthropathy or other focal bone abnormality. Small accessory ossicle at the base of the second and third metacarpals. Soft tissues are unremarkable. IMPRESSION: No acute fracture or dislocation of the right hand. Electronically Signed   By: Narda Rutherford M.D.   On: 02/25/2020 18:25    Procedures Procedures (including critical care time)  Medications Ordered in ED Medications  acetaminophen (TYLENOL) tablet 650 mg (has no administration in time range)  ibuprofen (ADVIL) tablet 600 mg (has no administration in time range)    ED Course  I have reviewed the triage vital signs and the nursing notes.  Pertinent labs & imaging results that were available during my care of the patient were reviewed by me and considered in my medical decision making (see chart for details).    MDM Rules/Calculators/A&P                          History provided by patient with additional history obtained from chart review.    Patient presents to the ED with complaints of pain to the right hand and wrist s/p hyperflexion injury. Exam without obvious deformity or open wounds. ROM intact. Tender to palpation on base of third and fourth fingers and lateral aspect of wrist. No anatomic snuffbox tenderness. NVI distally. Xray viewed by me is negative for fracture/dislocation. Therapeutic splint provided. PRICE and motrin recommended. Advised not to take Goody's Powder I discussed results, treatment plan, need for pcp or ortho follow-up, and return precautions with the patient. Provided opportunity for questions, patient confirmed understanding and are in agreement with plan.    Portions of this note were generated with Scientist, clinical (histocompatibility and immunogenetics). Dictation errors may occur despite best  attempts at proofreading.  Final Clinical Impression(s) / ED Diagnoses Final diagnoses:  Right hand pain    Rx / DC Orders ED Discharge Orders    None       Kandice Hams 02/25/20 1921    Pollyann Savoy, MD 02/25/20 2116

## 2020-03-20 ENCOUNTER — Other Ambulatory Visit: Payer: Self-pay

## 2020-03-20 ENCOUNTER — Encounter (HOSPITAL_BASED_OUTPATIENT_CLINIC_OR_DEPARTMENT_OTHER): Payer: Self-pay | Admitting: *Deleted

## 2020-03-20 DIAGNOSIS — F17210 Nicotine dependence, cigarettes, uncomplicated: Secondary | ICD-10-CM | POA: Insufficient documentation

## 2020-03-20 DIAGNOSIS — Z20822 Contact with and (suspected) exposure to covid-19: Secondary | ICD-10-CM | POA: Insufficient documentation

## 2020-03-20 DIAGNOSIS — R0981 Nasal congestion: Secondary | ICD-10-CM | POA: Insufficient documentation

## 2020-03-20 NOTE — ED Triage Notes (Signed)
C/o nasal congestion and sinus pain

## 2020-03-21 ENCOUNTER — Emergency Department (HOSPITAL_BASED_OUTPATIENT_CLINIC_OR_DEPARTMENT_OTHER)
Admission: EM | Admit: 2020-03-21 | Discharge: 2020-03-21 | Disposition: A | Discharge status: Home / Self Care | Payer: Self-pay | Attending: Emergency Medicine | Admitting: Emergency Medicine

## 2020-03-21 DIAGNOSIS — R0989 Other specified symptoms and signs involving the circulatory and respiratory systems: Secondary | ICD-10-CM

## 2020-03-21 LAB — SARS CORONAVIRUS 2 (TAT 6-24 HRS): SARS Coronavirus 2: NEGATIVE

## 2020-03-21 MED ORDER — DEXAMETHASONE SODIUM PHOSPHATE 10 MG/ML IJ SOLN
10.0000 mg | Freq: Once | INTRAMUSCULAR | Status: AC
  Administered 2020-03-21: 10 mg via INTRAMUSCULAR
  Filled 2020-03-21: qty 1

## 2020-03-21 MED ORDER — MOMETASONE FUROATE 50 MCG/ACT NA SUSP: 2.0000 | Freq: Every day | NASAL | 12 refills | Status: AC

## 2020-03-21 NOTE — Discharge Instructions (Addendum)
Run a humidifier in the room that you sleep if possible.  Purchase over-the-counter saline nasal spray and use it frequently in both sides of your nose.

## 2020-03-21 NOTE — ED Provider Notes (Signed)
[facility] EMERGENCY DEPARTMENT Provider Note   CSN: [id number] Arrival date & time: 03/20/20  2240     History Chief Complaint  Patient presents with  . Sinus Problem    Mary Kidd is a 40 y.o. female.  Patient presents to the emergency department for evaluation of sinus congestion, burning in her nasal passages, alteration of her sense of smell.  Symptoms ongoing for a couple of days.  Patient reports that she had Covid 2 or 3 months ago and lost her smell, but it had been returning.  Over the last couple of days she has noticed a change in the way things smell and taste.  She has not had any fever.        History reviewed. No pertinent past medical history.  There are no problems to display for this patient.   Past Surgical History:  Procedure Laterality Date  . DILATION AND CURETTAGE OF UTERUS       OB History   No obstetric history on file.     No family history on file.  Social History   Tobacco Use  . Smoking status: Current Every Day Smoker    Packs/day: 0.50    Types: Cigarettes  . Smokeless tobacco: Never Used  Vaping Use  . Vaping Use: Never used  Substance Use Topics  . Alcohol use: Not Currently  . Drug use: Never    Home Medications Prior to Admission medications   Medication Sig Start Date End Date Taking? Authorizing Provider  mometasone (NASONEX) 50 MCG/ACT nasal spray Place 2 sprays into the nose daily. 03/21/20  Yes Abbygail [place]hoite, Canary Brim, MD  naproxen (NAPROSYN) 375 MG tablet Take 1 tablet twice daily for pain. 11/15/18   Molpus, John, MD    Allergies    Penicillins  Review of Systems   Review of Systems  HENT: Positive for congestion and sinus pain.   All other systems reviewed and are negative.   Physical Exam Updated Vital Signs BP (!) 146/89   Pulse 81   Temp 98.3 F (36.8 C) (Oral)   Resp 16   Ht 4\' 8"  (1.422 m)   Wt 77.6 kg   SpO2 100%   BMI 38.34 kg/m   Physical Exam Vitals and nursing note  reviewed.  Constitutional:      General: She is not in acute distress.    Appearance: Normal appearance. She is well-developed and well-nourished.  HENT:     Head: Normocephalic and atraumatic.     Right Ear: Hearing normal.     Left Ear: Hearing normal.     Nose:     Left Turbinates: Swollen.     Right Sinus: Frontal sinus tenderness present.     Left Sinus: Frontal sinus tenderness present.     Mouth/Throat:     Mouth: Oropharynx is clear and moist and mucous membranes are normal.  Eyes:     Extraocular Movements: EOM normal.     Conjunctiva/sclera: Conjunctivae normal.     Pupils: Pupils are equal, round, and reactive to light.  Cardiovascular:     Rate and Rhythm: Regular rhythm.     Heart sounds: S1 normal and S2 normal. No murmur heard. No friction rub. No gallop.   Pulmonary:     Effort: Pulmonary effort is normal. No respiratory distress.     Breath sounds: Normal breath sounds.  Chest:     Chest wall: No tenderness.  Abdominal:     General: Bowel sounds are normal.  Palpations: Abdomen is soft. There is no hepatosplenomegaly.     Tenderness: There is no abdominal tenderness. There is no guarding or rebound. Negative signs include Murphy's sign and McBurney's sign.     Hernia: No hernia is present.  Musculoskeletal:        General: Normal range of motion.     Cervical back: Normal range of motion and neck supple.  Skin:    General: Skin is warm, dry and intact.     Findings: No rash.     Nails: There is no cyanosis.  Neurological:     Mental Status: She is alert and oriented to person, place, and time.     GCS: GCS eye subscore is 4. GCS verbal subscore is 5. GCS motor subscore is 6.     Cranial Nerves: No cranial nerve deficit.     Sensory: No sensory deficit.     Coordination: Coordination normal.     Deep Tendon Reflexes: Strength normal.  Psychiatric:        Mood and Affect: Mood and affect normal.        Speech: Speech normal.        Behavior: Behavior  normal.        Thought Content: Thought content normal.     ED Results / Procedures / Treatments   Labs (all labs ordered are listed, but only abnormal results are displayed) Labs Reviewed  SARS CORONAVIRUS 2 (TAT 6-24 HRS)    EKG None  Radiology No results found.  Procedures Procedures (including critical care time)  Medications Ordered in ED Medications  dexamethasone (DECADRON) injection 10 mg (has no administration in time range)    ED Course  I have reviewed the triage vital signs and the nursing notes.  Pertinent labs & imaging results that were available during my care of the patient were reviewed by me and considered in my medical decision making (see chart for details).    MDM Rules/Calculators/A&P                          Patient with sinus congestion, sinus pain, alteration in her sense of smell.  She has previously had Covid infection and did lose her sense of smell.  She reports that she can still smell but thinks smell different than she would anticipate.  We will retest for Covid.  Otherwise we will treat symptomatically for sinus inflammation and congestion. Final Clinical Impression(s) / ED Diagnoses Final diagnoses:  None    Rx / DC Orders ED Discharge Orders         Ordered    mometasone (NASONEX) 50 MCG/ACT nasal spray  Daily        03/21/20 0115           Gilda Crease, MD 03/21/20 [phone]

## 2020-04-11 ENCOUNTER — Emergency Department (HOSPITAL_BASED_OUTPATIENT_CLINIC_OR_DEPARTMENT_OTHER)
Admission: EM | Admit: 2020-04-11 | Discharge: 2020-04-11 | Disposition: A | Discharge status: Home / Self Care | Payer: Self-pay | Attending: Emergency Medicine | Admitting: Emergency Medicine

## 2020-04-11 ENCOUNTER — Encounter (HOSPITAL_BASED_OUTPATIENT_CLINIC_OR_DEPARTMENT_OTHER): Payer: Self-pay | Admitting: Emergency Medicine

## 2020-04-11 ENCOUNTER — Other Ambulatory Visit: Payer: Self-pay

## 2020-04-11 DIAGNOSIS — J0100 Acute maxillary sinusitis, unspecified: Secondary | ICD-10-CM | POA: Insufficient documentation

## 2020-04-11 DIAGNOSIS — F17210 Nicotine dependence, cigarettes, uncomplicated: Secondary | ICD-10-CM | POA: Insufficient documentation

## 2020-04-11 MED ORDER — AZITHROMYCIN 250 MG PO TABS
500.0000 mg | ORAL_TABLET | Freq: Once | ORAL | Status: AC
  Administered 2020-04-11: 500 mg via ORAL
  Filled 2020-04-11: qty 2

## 2020-04-11 MED ORDER — AZITHROMYCIN 250 MG PO TABS: 250.0000 mg | ORAL_TABLET | Freq: Every day | ORAL | 0 refills | Status: AC

## 2020-04-11 MED ORDER — PREDNISONE 50 MG PO TABS
60.0000 mg | ORAL_TABLET | Freq: Once | ORAL | Status: AC
  Administered 2020-04-11: 60 mg via ORAL
  Filled 2020-04-11: qty 1

## 2020-04-11 MED ORDER — PREDNISONE 20 MG PO TABS: 40.0000 mg | ORAL_TABLET | Freq: Every day | ORAL | 0 refills | Status: AC

## 2020-04-11 NOTE — ED Triage Notes (Signed)
Pt is c/o sinus congestion and states that her sinuses feel dry  Pt states now she has a dry cough  Pt states she had a covid test here a few days ago that was negative  Pt states she is taking claritin and mometasone furoate nasal spray without relief  Pt states she started taking robitussin for her cough

## 2020-04-11 NOTE — ED Provider Notes (Signed)
[facility] EMERGENCY DEPARTMENT Provider Note   CSN: [id number] Arrival date & time: 04/11/20  0106     History Chief Complaint  Patient presents with  . Nasal Congestion    Mary Kidd is a 40 y.o. female.  Persistent sinus congestion, pain, intermittent drainage.  Seen 3 weeks ago with same, no better.  Now over the last couple of days has a dry, nonproductive cough.  No shortness of breath.        History reviewed. No pertinent past medical history.  There are no problems to display for this patient.   Past Surgical History:  Procedure Laterality Date  . DILATION AND CURETTAGE OF UTERUS       OB History   No obstetric history on file.     History reviewed. No pertinent family history.  Social History   Tobacco Use  . Smoking status: Current Every Day Smoker    Packs/day: 0.50    Types: Cigarettes  . Smokeless tobacco: Never Used  Vaping Use  . Vaping Use: Never used  Substance Use Topics  . Alcohol use: Not Currently  . Drug use: Never    Home Medications Prior to Admission medications   Medication Sig Start Date End Date Taking? Authorizing Provider  mometasone (NASONEX) 50 MCG/ACT nasal spray Place 2 sprays into the nose daily. 03/21/20   Gilda Crease, MD  naproxen (NAPROSYN) 375 MG tablet Take 1 tablet twice daily for pain. 11/15/18   Molpus, John, MD    Allergies    Penicillins  Review of Systems   Review of Systems  HENT: Positive for congestion and sinus pain.   Respiratory: Positive for cough.   All other systems reviewed and are negative.   Physical Exam Updated Vital Signs BP 121/86 (BP Location: Left Arm)   Pulse 71   Temp 98.4 F (36.9 C) (Oral)   Resp 18   Ht 4\' 8"  (1.422 m)   Wt 77.6 kg   LMP 04/08/2020 (Exact Date)   SpO2 99%   BMI 38.35 kg/m   Physical Exam Vitals and nursing note reviewed.  Constitutional:      General: She is not in acute distress.    Appearance: Normal appearance. She is  well-developed and well-nourished.  HENT:     Head: Normocephalic and atraumatic.     Right Ear: Hearing normal.     Left Ear: Hearing normal.     Nose: Mucosal edema and congestion present.     Right Sinus: Maxillary sinus tenderness and frontal sinus tenderness present.     Left Sinus: Maxillary sinus tenderness and frontal sinus tenderness present.     Mouth/Throat:     Mouth: Oropharynx is clear and moist and mucous membranes are normal.  Eyes:     Extraocular Movements: EOM normal.     Conjunctiva/sclera: Conjunctivae normal.     Pupils: Pupils are equal, round, and reactive to light.  Cardiovascular:     Rate and Rhythm: Regular rhythm.     Heart sounds: S1 normal and S2 normal. No murmur heard. No friction rub. No gallop.   Pulmonary:     Effort: Pulmonary effort is normal. No respiratory distress.     Breath sounds: Normal breath sounds.  Chest:     Chest wall: No tenderness.  Abdominal:     General: Bowel sounds are normal.     Palpations: Abdomen is soft. There is no hepatosplenomegaly.     Tenderness: There is no abdominal tenderness. There  is no guarding or rebound. Negative signs include Murphy's sign and McBurney's sign.     Hernia: No hernia is present.  Musculoskeletal:        General: Normal range of motion.     Cervical back: Normal range of motion and neck supple.  Skin:    General: Skin is warm, dry and intact.     Findings: No rash.     Nails: There is no cyanosis.  Neurological:     Mental Status: She is alert and oriented to person, place, and time.     GCS: GCS eye subscore is 4. GCS verbal subscore is 5. GCS motor subscore is 6.     Cranial Nerves: No cranial nerve deficit.     Sensory: No sensory deficit.     Coordination: Coordination normal.     Deep Tendon Reflexes: Strength normal.  Psychiatric:        Mood and Affect: Mood and affect normal.        Speech: Speech normal.        Behavior: Behavior normal.        Thought Content: Thought  content normal.     ED Results / Procedures / Treatments   Labs (all labs ordered are listed, but only abnormal results are displayed) Labs Reviewed - No data to display  EKG None  Radiology No results found.  Procedures Procedures   Medications Ordered in ED Medications  predniSONE (DELTASONE) tablet 60 mg (has no administration in time range)  azithromycin (ZITHROMAX) tablet 500 mg (has no administration in time range)    ED Course  I have reviewed the triage vital signs and the nursing notes.  Pertinent labs & imaging results that were available during my care of the patient were reviewed by me and considered in my medical decision making (see chart for details).    MDM Rules/Calculators/A&P                          3 weeks or more of persistent sinus infection symptoms.  Has been using Nasacort, OTC meds without improvement.  Treat with prednisone, Zithromax.  Final Clinical Impression(s) / ED Diagnoses Final diagnoses:  Acute non-recurrent maxillary sinusitis    Rx / DC Orders ED Discharge Orders    None       Gilda Crease, MD 04/11/20 [phone]

## 2020-04-27 IMAGING — DX DG KNEE COMPLETE 4+V*R*
5 series · 5 of 5 positions shown · non-contrast
Comparison: None.

CLINICAL DATA: Initial evaluation for acute knee pain status post
injury.

EXAM:
RIGHT KNEE - COMPLETE 4+ VIEW

[knee ap]
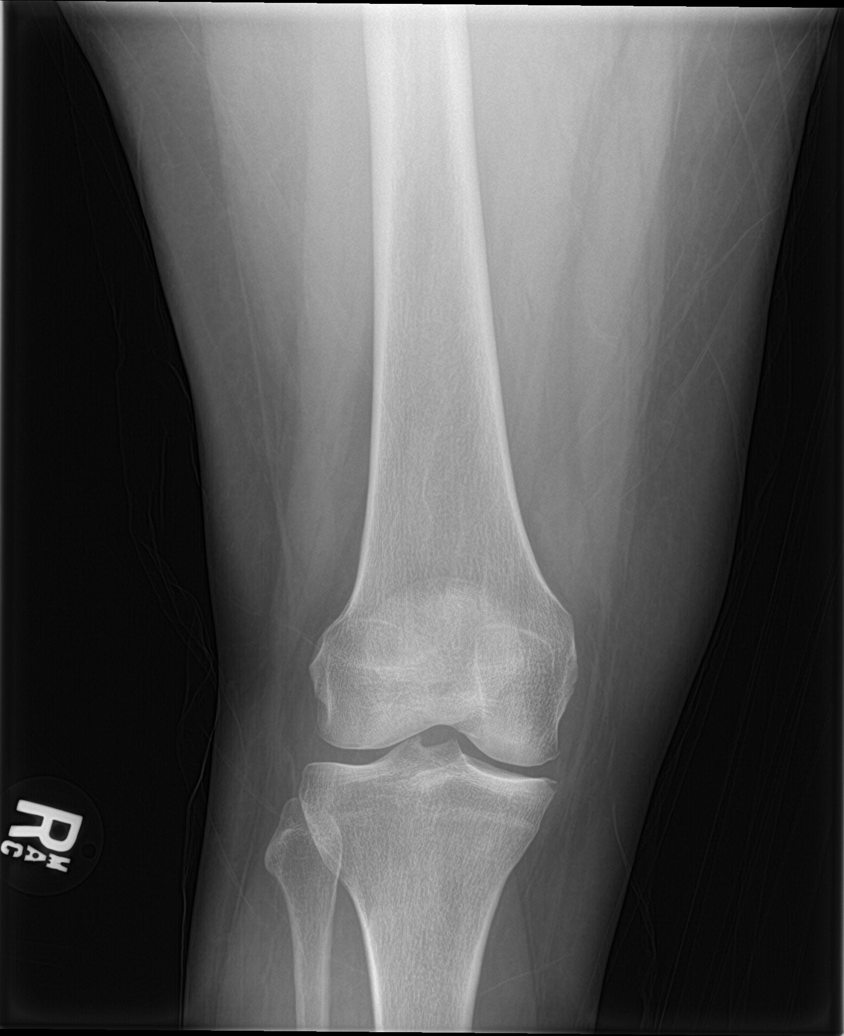

[knee lat (1 of 2)]
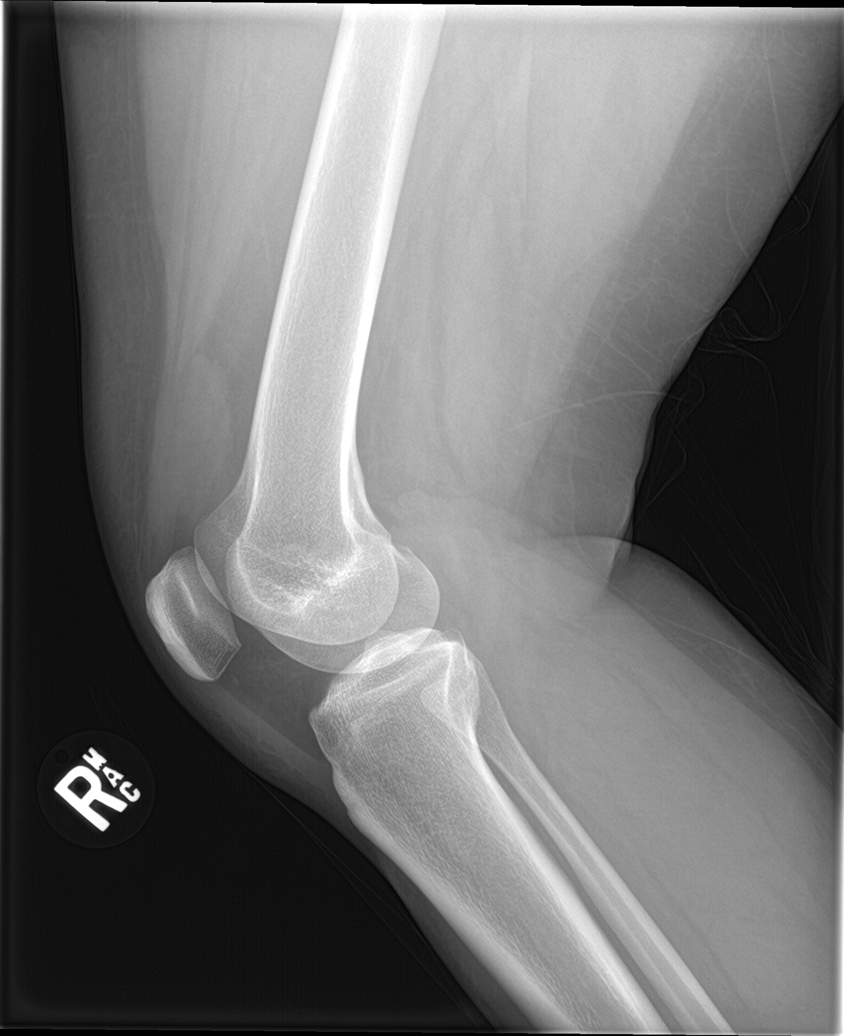

[knee obl (1 of 2)]
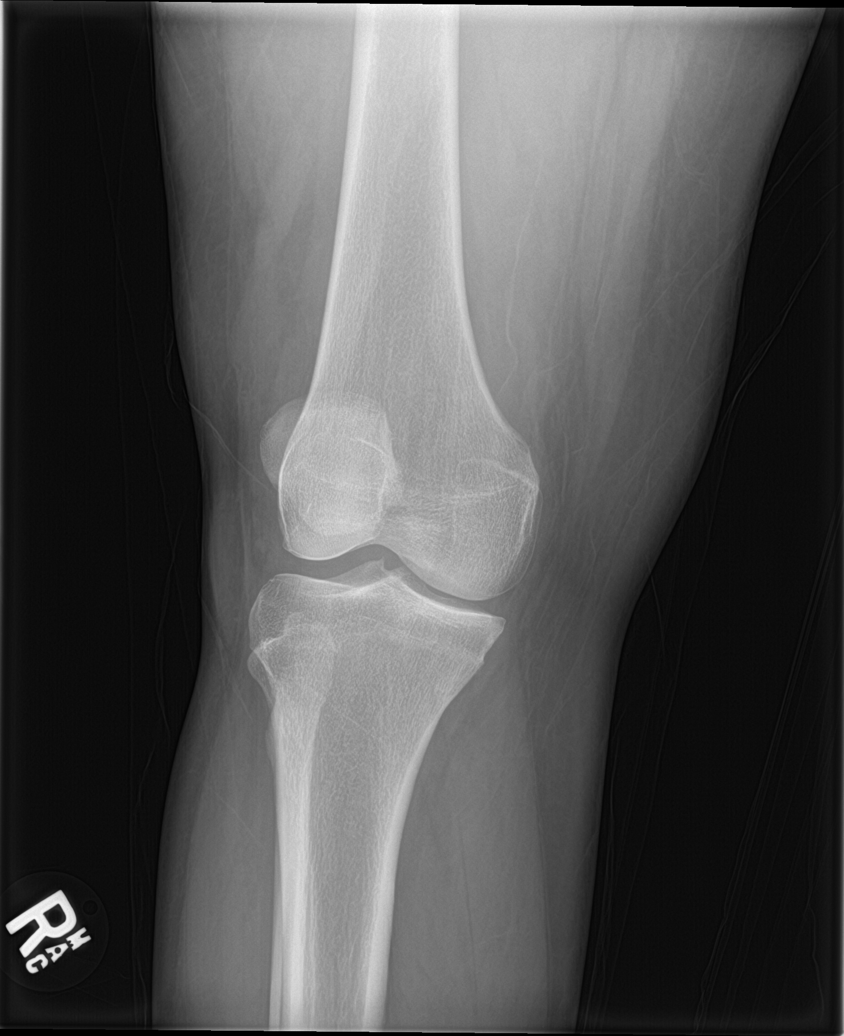

[knee obl (2 of 2)]
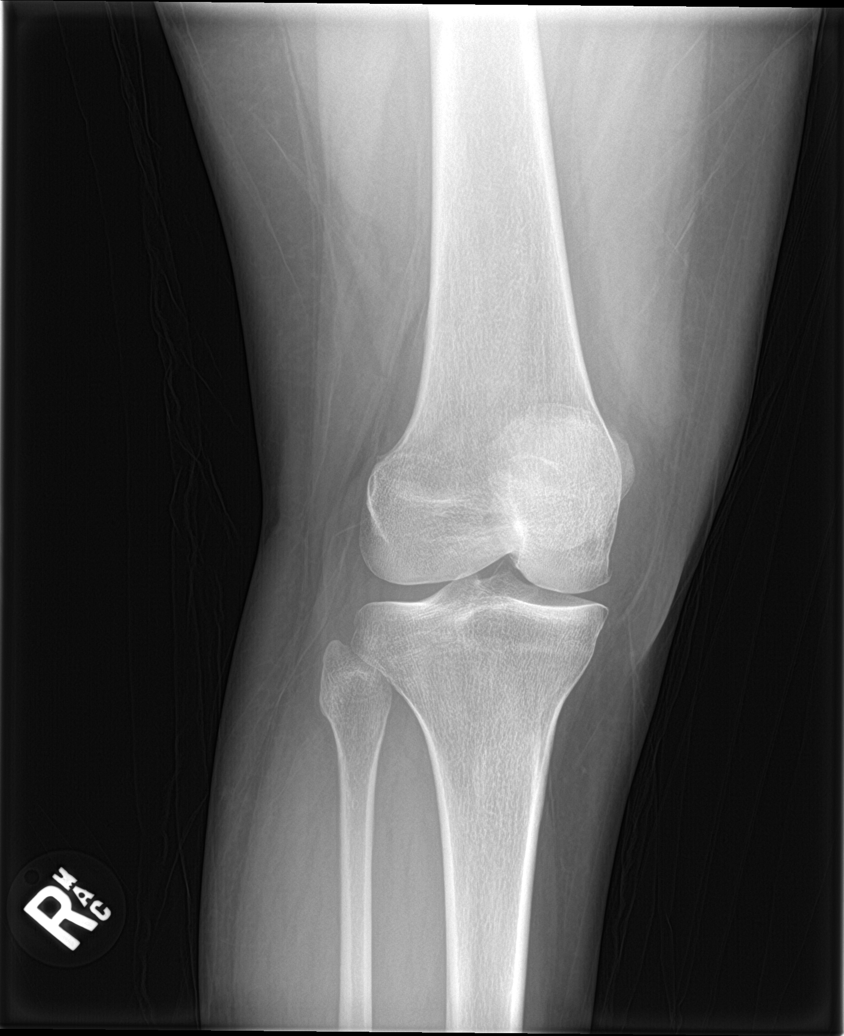

[knee lat (2 of 2)]
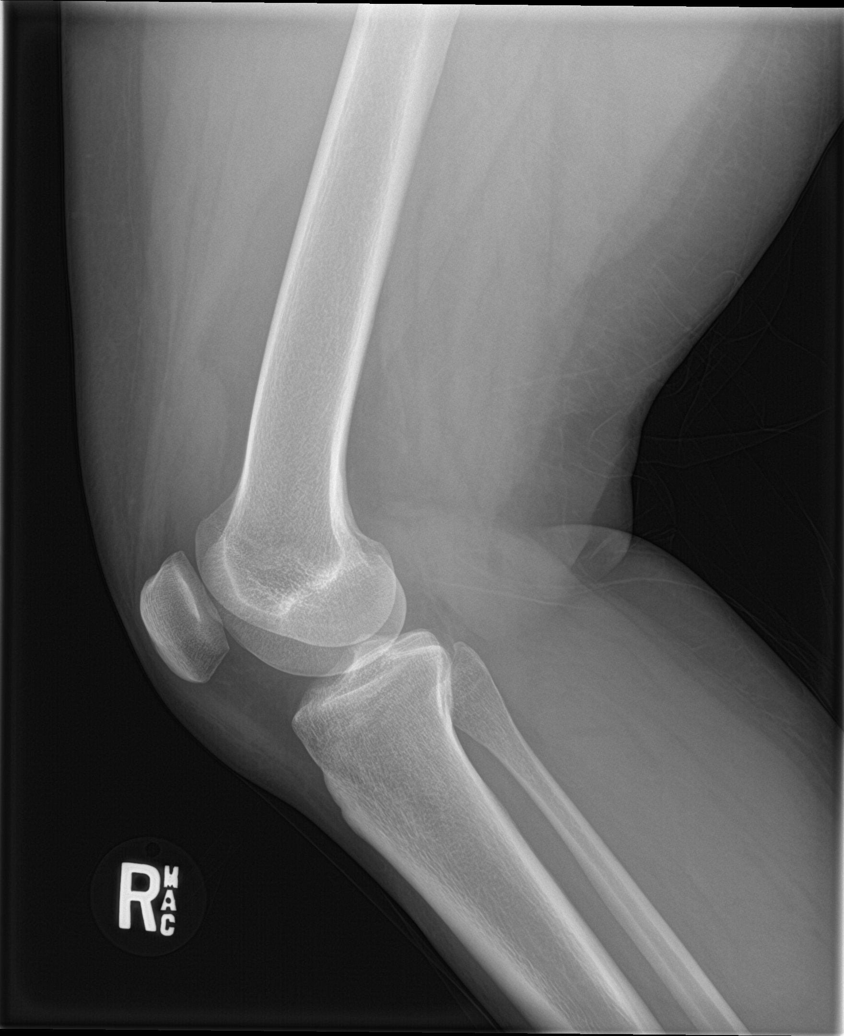

[5 of 5 positions shown; findings below may reference images not displayed]

FINDINGS: No acute fracture or dislocation. Small to moderate joint effusion
seen within the suprapatellar recess. Mild/early osteoarthritic
changes present at the medial femorotibial joint space compartment.
Joint spaces otherwise maintained. No visible soft tissue injury.
IMPRESSION: 1. No acute fracture or dislocation.
2. Small to moderate joint effusion within the suprapatellar recess.
3. Mild/early osteoarthritic changes at the medial femorotibial
joint space compartment.

## 2020-11-11 ENCOUNTER — Other Ambulatory Visit: Payer: Self-pay

## 2020-11-11 ENCOUNTER — Encounter (HOSPITAL_BASED_OUTPATIENT_CLINIC_OR_DEPARTMENT_OTHER): Payer: Self-pay | Admitting: *Deleted

## 2020-11-11 ENCOUNTER — Emergency Department (HOSPITAL_BASED_OUTPATIENT_CLINIC_OR_DEPARTMENT_OTHER)
Admission: EM | Admit: 2020-11-11 | Discharge: 2020-11-11 | Disposition: A | Discharge status: Home / Self Care | Payer: Self-pay | Attending: Emergency Medicine | Admitting: Emergency Medicine

## 2020-11-11 DIAGNOSIS — Z79899 Other long term (current) drug therapy: Secondary | ICD-10-CM | POA: Insufficient documentation

## 2020-11-11 DIAGNOSIS — F17210 Nicotine dependence, cigarettes, uncomplicated: Secondary | ICD-10-CM | POA: Insufficient documentation

## 2020-11-11 DIAGNOSIS — G44209 Tension-type headache, unspecified, not intractable: Secondary | ICD-10-CM

## 2020-11-11 DIAGNOSIS — M25519 Pain in unspecified shoulder: Secondary | ICD-10-CM | POA: Insufficient documentation

## 2020-11-11 MED ORDER — METHOCARBAMOL 500 MG PO TABS: 500.0000 mg | ORAL_TABLET | Freq: Two times a day (BID) | ORAL | 0 refills | Status: AC | PRN

## 2020-11-11 MED ORDER — METHOCARBAMOL 500 MG PO TABS
500.0000 mg | ORAL_TABLET | Freq: Once | ORAL | Status: AC
  Administered 2020-11-11: 500 mg via ORAL
  Filled 2020-11-11: qty 1

## 2020-11-11 MED ORDER — IBUPROFEN 600 MG PO TABS: 600.0000 mg | ORAL_TABLET | Freq: Three times a day (TID) | ORAL | 0 refills | Status: DC | PRN

## 2020-11-11 MED ORDER — IBUPROFEN 400 MG PO TABS
600.0000 mg | ORAL_TABLET | Freq: Once | ORAL | Status: AC
  Administered 2020-11-11: 600 mg via ORAL
  Filled 2020-11-11: qty 1

## 2020-11-11 NOTE — ED Provider Notes (Signed)
[facility] EMERGENCY DEPARTMENT Provider Note   CSN: [id number] Arrival date & time: 11/11/20  1753     History Chief Complaint  Patient presents with   Headache    Mary Kidd is a 40 y.o. female.  She is here with complaint of headache in the back of her head radiating down her neck and trapezius muscles and into her shoulders.  Worse with movement.  Has tried Tylenol without improvement.  No known trauma.  No blurry vision double vision fevers or chills.  No sore throat.  The history is provided by the patient.  Headache Pain location:  Occipital Quality:  Dull Radiates to:  L neck, R neck, L shoulder and R shoulder Severity currently:  9/10 Severity at highest:  9/10 Onset quality:  Gradual Duration:  4 days Timing:  Constant Progression:  Unchanged Chronicity:  New Context: emotional stress   Relieved by:  Nothing Worsened by:  Neck movement Ineffective treatments:  Acetaminophen Associated symptoms: neck pain   Associated symptoms: no abdominal pain, no blurred vision, no congestion, no diarrhea, no ear pain, no eye pain, no fever, no nausea, no sore throat, no visual change and no vomiting       History reviewed. No pertinent past medical history.  There are no problems to display for this patient.   Past Surgical History:  Procedure Laterality Date   DILATION AND CURETTAGE OF UTERUS       OB History   No obstetric history on file.     No family history on file.  Social History   Tobacco Use   Smoking status: Every Day    Packs/day: 0.50    Types: Cigarettes   Smokeless tobacco: Never  Vaping Use   Vaping Use: Never used  Substance Use Topics   Alcohol use: Not Currently   Drug use: Never    Home Medications Prior to Admission medications   Medication Sig Start Date End Date Taking? Authorizing Provider  azithromycin (ZITHROMAX) 250 MG tablet Take 1 tablet (250 mg total) by mouth daily. 04/11/20   Gilda Crease, MD   mometasone (NASONEX) 50 MCG/ACT nasal spray Place 2 sprays into the nose daily. 03/21/20   Gilda Crease, MD  naproxen (NAPROSYN) 375 MG tablet Take 1 tablet twice daily for pain. 11/15/18   Molpus, John, MD  predniSONE (DELTASONE) 20 MG tablet Take 2 tablets (40 mg total) by mouth daily with breakfast. 04/11/20   Pollina, Canary Brim, MD    Allergies    Penicillins  Review of Systems   Review of Systems  Constitutional:  Negative for fever.  HENT:  Negative for congestion, ear pain and sore throat.   Eyes:  Negative for blurred vision and pain.  Respiratory:  Negative for shortness of breath.   Cardiovascular:  Negative for chest pain.  Gastrointestinal:  Negative for abdominal pain, diarrhea, nausea and vomiting.  Genitourinary:  Negative for dysuria.  Musculoskeletal:  Positive for neck pain.  Skin:  Negative for rash.  Neurological:  Positive for headaches.   Physical Exam Updated Vital Signs BP (!) 119/96 (BP Location: Right Arm)   Pulse 80   Temp 98.7 F (37.1 C) (Oral)   Resp 18   Ht 4\' 9"  (1.448 m)   Wt 73.9 kg   LMP 10/12/2020   SpO2 97%   BMI 35.27 kg/m   Physical Exam Vitals and nursing note reviewed.  Constitutional:      General: She is not in acute distress.  Appearance: She is well-developed.  HENT:     Head: Normocephalic and atraumatic.  Eyes:     Conjunctiva/sclera: Conjunctivae normal.  Cardiovascular:     Rate and Rhythm: Normal rate and regular rhythm.     Heart sounds: No murmur heard. Pulmonary:     Effort: Pulmonary effort is normal. No respiratory distress.     Breath sounds: Normal breath sounds.  Abdominal:     Palpations: Abdomen is soft.     Tenderness: There is no abdominal tenderness.  Musculoskeletal:     Cervical back: Neck supple. No rigidity.  Skin:    General: Skin is warm and dry.  Neurological:     Mental Status: She is alert.     Cranial Nerves: No cranial nerve deficit or dysarthria.     Sensory: No sensory  deficit.     Motor: No weakness.     Gait: Gait normal.    ED Results / Procedures / Treatments   Labs (all labs ordered are listed, but only abnormal results are displayed) Labs Reviewed - No data to display  EKG None  Radiology No results found.  Procedures Procedures   Medications Ordered in ED Medications  ibuprofen (ADVIL) tablet 600 mg (has no administration in time range)  methocarbamol (ROBAXIN) tablet 500 mg (has no administration in time range)    ED Course  I have reviewed the triage vital signs and the nursing notes.  Pertinent labs & imaging results that were available during my care of the patient were reviewed by me and considered in my medical decision making (see chart for details).    MDM Rules/Calculators/A&P                           Well-appearing female with no significant past medical history here with 4 days of headache neck pain upper back pain shoulder pain.  No trauma no fever no visual symptoms.  No neurologic symptoms.  Reproducible pain with palpation of her musculature.  No meningismus.  Likely tension headache.  Doubt subarachnoid hemorrhage due to gradual onset of pain and no meningismus.  Do not feel CNS imaging warranted at this time or other testing.  Symptomatic treatment with NSAIDs and muscle relaxants.  Return instructions discussed Final Clinical Impression(s) / ED Diagnoses Final diagnoses:  Tension headache    Rx / DC Orders ED Discharge Orders     None        Terrilee Files, MD 11/12/20 1125

## 2020-11-11 NOTE — Discharge Instructions (Addendum)
You are seen in the emergency department for headache neck and upper back pain.  This is likely a tension headache.  You can do warm compress to the area along with we are prescribing you some ibuprofen and muscle relaxant.  Gentle stretching.  Follow-up with your doctor.  Return to the emergency department if any worsening or concerning symptoms

## 2020-11-11 NOTE — ED Triage Notes (Signed)
C/o h/a x 4 days

## 2021-02-22 IMAGING — DX DG CHEST 1V PORT
1 series · 1 of 1 positions shown · non-contrast
Comparison: March 31, 2018.

CLINICAL DATA: Productive cough, shortness of breath.

EXAM:
PORTABLE CHEST 1 VIEW

[chest ap]
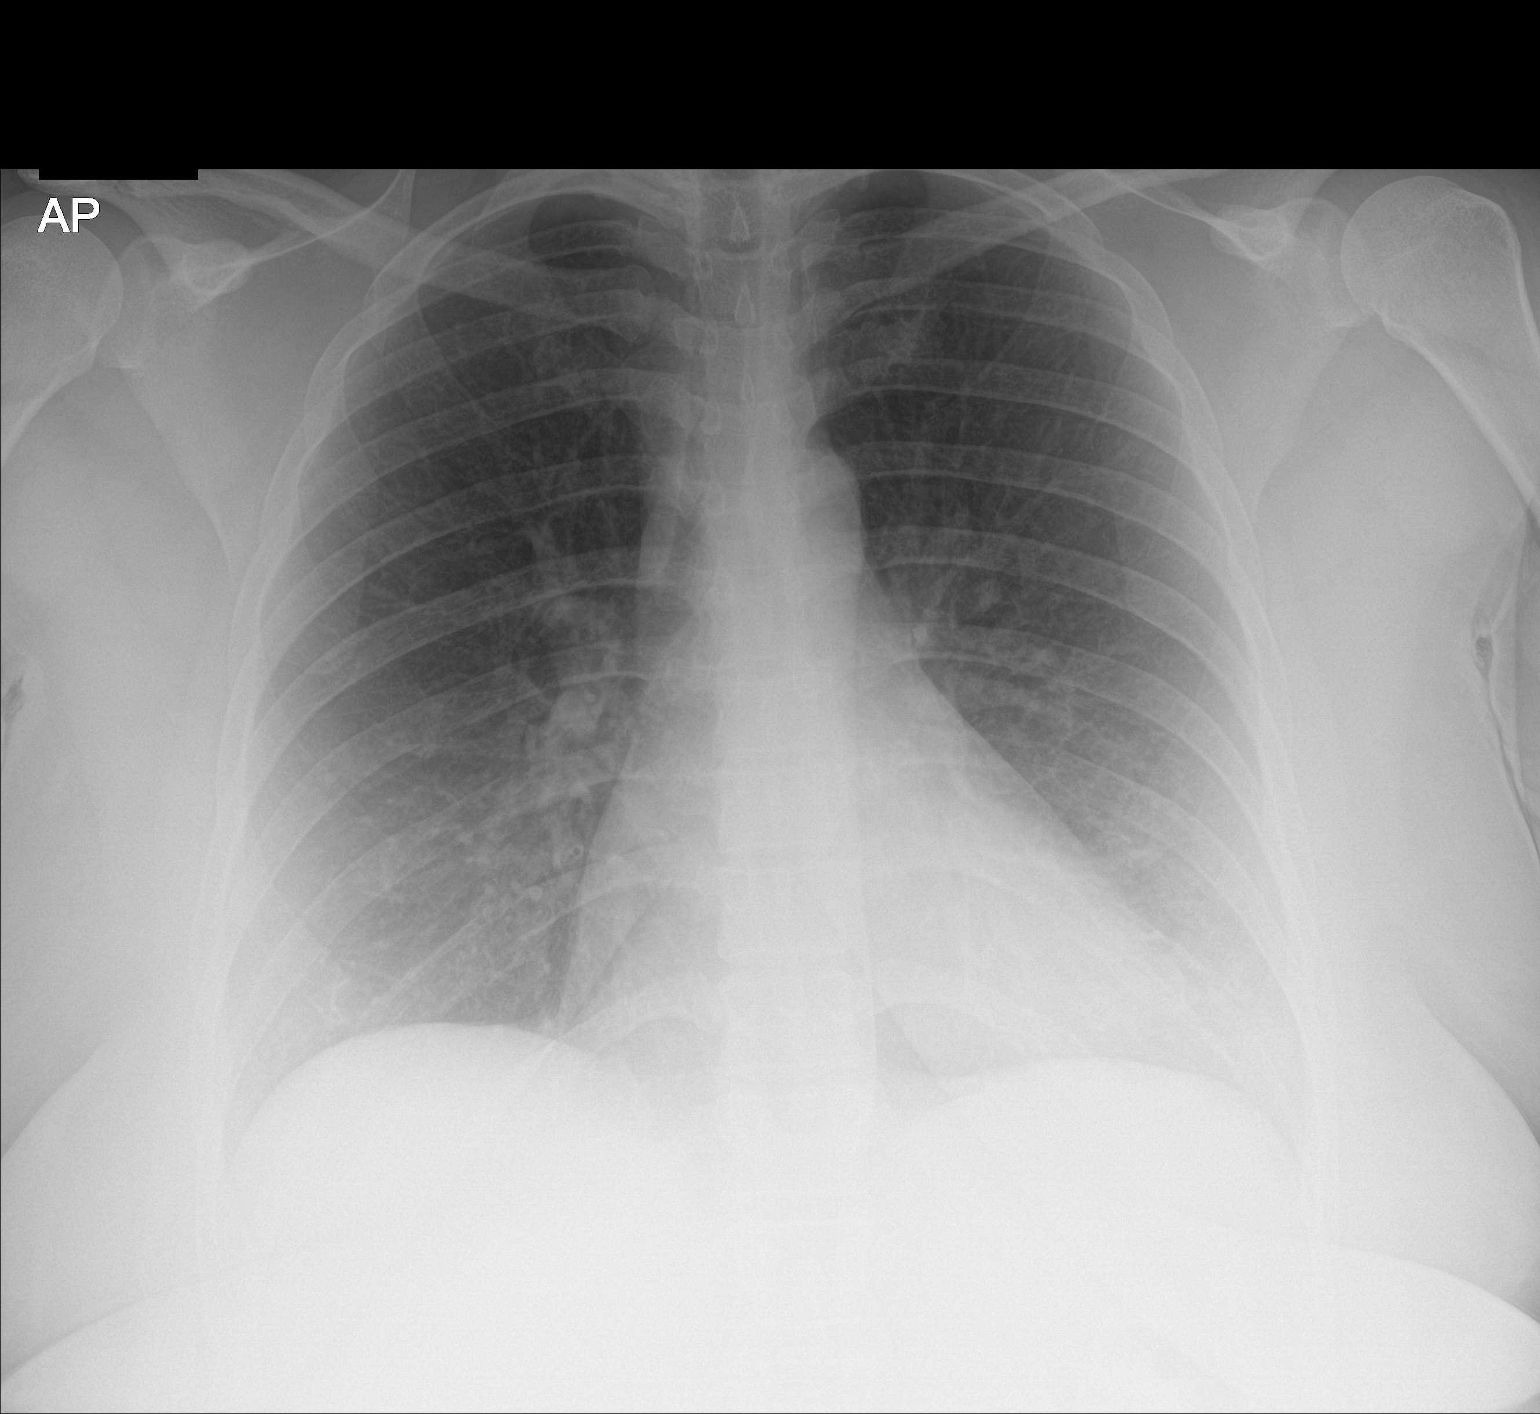

[1 of 1 positions shown; findings below may reference images not displayed]

FINDINGS: The heart size and mediastinal contours are within normal limits.
Both lungs are clear. No pneumothorax or pleural effusion is noted.
The visualized skeletal structures are unremarkable.
IMPRESSION: No active disease.

## 2021-05-24 ENCOUNTER — Other Ambulatory Visit: Payer: Self-pay

## 2021-05-24 ENCOUNTER — Encounter (HOSPITAL_BASED_OUTPATIENT_CLINIC_OR_DEPARTMENT_OTHER): Payer: Self-pay | Admitting: Emergency Medicine

## 2021-05-24 ENCOUNTER — Emergency Department (HOSPITAL_BASED_OUTPATIENT_CLINIC_OR_DEPARTMENT_OTHER)
Admission: EM | Admit: 2021-05-24 | Discharge: 2021-05-24 | Disposition: A | Discharge status: Home / Self Care | Payer: Self-pay | Attending: Emergency Medicine | Admitting: Emergency Medicine

## 2021-05-24 DIAGNOSIS — M26609 Unspecified temporomandibular joint disorder, unspecified side: Secondary | ICD-10-CM

## 2021-05-24 DIAGNOSIS — M542 Cervicalgia: Secondary | ICD-10-CM | POA: Insufficient documentation

## 2021-05-24 DIAGNOSIS — M26622 Arthralgia of left temporomandibular joint: Secondary | ICD-10-CM | POA: Insufficient documentation

## 2021-05-24 DIAGNOSIS — K0889 Other specified disorders of teeth and supporting structures: Secondary | ICD-10-CM

## 2021-05-24 DIAGNOSIS — R0981 Nasal congestion: Secondary | ICD-10-CM | POA: Insufficient documentation

## 2021-05-24 MED ORDER — AMOXICILLIN-POT CLAVULANATE 875-125 MG PO TABS: 1.0000 | ORAL_TABLET | Freq: Two times a day (BID) | ORAL | 0 refills | Status: AC

## 2021-05-24 MED ORDER — FLUCONAZOLE 150 MG PO TABS: ORAL_TABLET | ORAL | 0 refills | Status: DC

## 2021-05-24 MED ORDER — KETOROLAC TROMETHAMINE 15 MG/ML IJ SOLN
15.0000 mg | Freq: Once | INTRAMUSCULAR | Status: AC
  Administered 2021-05-24: 15 mg via INTRAMUSCULAR
  Filled 2021-05-24: qty 1

## 2021-05-24 MED ORDER — NAPROXEN 500 MG PO TABS: 500.0000 mg | ORAL_TABLET | Freq: Two times a day (BID) | ORAL | 0 refills | Status: AC

## 2021-05-24 NOTE — Discharge Instructions (Addendum)
You should buy a bite guard at your local drugstore and wear this at night to avoid gritting her teeth and avoid damaging her teeth.  Take the medication provided to help with your pain.  I have also prescribed an antibiotic given that you had a cavity noted and there is concern for some infection in your tooth that could be contributing to symptoms.  Additionally I prescribed a dose of fluconazole in case you get a yeast infection from the antibiotic.  You will need to take this if you have symptoms of a yeast infection.  Please take this as directed.  Have given you some information to follow-up with dentist.  Please call the office to schedule an appointment for follow-up.  Please return the emergency department for any new or worsening symptoms the meantime. ?

## 2021-05-24 NOTE — ED Triage Notes (Signed)
Pt arrives pov with steady gait, c/o HA, bilateral eye pain and left ear pain x 2 weeks. OTC meds not working per pt statement. AOx4, bilaterally equal, denies fever. Recent covid test neg per patient ?

## 2021-05-24 NOTE — ED Provider Notes (Signed)
?[facility] EMERGENCY DEPARTMENT ?Provider Note ? ? ?CSN: [id number] ?Arrival date & time: 05/24/21  1516 ? ?  ? ?History ? ?Chief Complaint  ?Patient presents with  ? Headache  ? ? ?Mary Kidd is a 41 y.o. female. ? ?HPI ? ?Patient is a 41 year old female who presents to the emergency department today for evaluation of pain to the left side of the face jaw and neck.  Also complaining of pain to the ear.  This is causing her to have a headache.  The pain is worse when pressing on her TMJ.  She does admit to teeth grinding and significant other states she does this especially frequently while she is sleeping.  Patient reports that she also has some dental pain associated with dental caries.  She has had no fevers or chills.  She has had some congestion and some pain to the left maxillary sinus for the last couple weeks as well.  Denies any associated neuro complaints. ? ?Home Medications ?Prior to Admission medications   ?Medication Sig Start Date End Date Taking? Authorizing Provider  ?amoxicillin-clavulanate (AUGMENTIN) 875-125 MG tablet Take 1 tablet by mouth 2 (two) times daily for 7 days. 05/24/21 05/31/21 Yes Criss Bartles S, PA-C  ?fluconazole (DIFLUCAN) 150 MG tablet Take one tablet on the day you fill the prescription. If you continue to have symptoms then take the second tablet in 3 days. 05/24/21  Yes Ellakate Gonsalves S, PA-C  ?naproxen (NAPROSYN) 500 MG tablet Take 1 tablet (500 mg total) by mouth 2 (two) times daily for 7 days. 05/24/21 05/31/21 Yes Nessie Nong S, PA-C  ?azithromycin (ZITHROMAX) 250 MG tablet Take 1 tablet (250 mg total) by mouth daily. 04/11/20   Gilda Crease, MD  ?ibuprofen (ADVIL) 600 MG tablet Take 1 tablet (600 mg total) by mouth every 8 (eight) hours as needed for moderate pain. 11/11/20   Terrilee Files, MD  ?methocarbamol (ROBAXIN) 500 MG tablet Take 1 tablet (500 mg total) by mouth 2 (two) times daily as needed for muscle spasms. 11/11/20   Terrilee Files, MD  ?mometasone (NASONEX) 50 MCG/ACT nasal spray Place 2 sprays into the nose daily. 03/21/20   Gilda Crease, MD  ?naproxen (NAPROSYN) 375 MG tablet Take 1 tablet twice daily for pain. 11/15/18   Molpus, John, MD  ?predniSONE (DELTASONE) 20 MG tablet Take 2 tablets (40 mg total) by mouth daily with breakfast. 04/11/20   Pollina, Canary Brim, MD  ?   ? ?Allergies    ?Penicillins   ? ?Review of Systems   ?Review of Systems ?See HPI for pertinent positives or negatives. ? ? ?Physical Exam ?Updated Vital Signs ?BP (!) 156/92 (BP Location: Left Arm)   Pulse 82   Temp 98.5 ?F (36.9 ?C) (Oral)   Resp 18   Ht 4\' 7"  (1.397 m)   Wt 78.5 kg   LMP 05/15/2021   SpO2 96%   BMI 40.21 kg/m?  ?Physical Exam ?Vitals and nursing note reviewed.  ?Constitutional:   ?   General: She is not in acute distress. ?   Appearance: She is well-developed.  ?HENT:  ?   Head: Normocephalic and atraumatic.  ?   Right Ear: Tympanic membrane normal.  ?   Left Ear: Tympanic membrane normal.  ?   Mouth/Throat:  ?   Comments: TTP to the left maxillary sinus. TTP to the left TMJ which reproduces pain. Dental carie with ttp to the left lower molar. No associated abscess. No  trismus. No LAD.  ?Eyes:  ?   Extraocular Movements: Extraocular movements intact.  ?   Conjunctiva/sclera: Conjunctivae normal.  ?   Pupils: Pupils are equal, round, and reactive to light.  ?Cardiovascular:  ?   Rate and Rhythm: Normal rate.  ?Pulmonary:  ?   Effort: Pulmonary effort is normal.  ?Musculoskeletal:     ?   General: No swelling.  ?   Cervical back: Neck supple.  ?Skin: ?   General: Skin is warm and dry.  ?   Capillary Refill: Capillary refill takes less than 2 seconds.  ?Neurological:  ?   General: No focal deficit present.  ?   Mental Status: She is alert.  ?   Cranial Nerves: No cranial nerve deficit.  ?Psychiatric:     ?   Mood and Affect: Mood normal.  ? ? ? ?ED Results / Procedures / Treatments   ?Labs ?(all labs ordered are listed, but only  abnormal results are displayed) ?Labs Reviewed - No data to display ? ?EKG ?None ? ?Radiology ?No results found. ? ?Procedures ?Procedures  ? ? ?Medications Ordered in ED ?Medications  ?ketorolac (TORADOL) 15 MG/ML injection 15 mg (15 mg Intramuscular Given 05/24/21 1628)  ? ? ?ED Course/ Medical Decision Making/ A&P ?  ?                        ?Medical Decision Making ?Risk ?Prescription drug management. ? ? ?41 year old female presents emergency department today for evaluation of left jaw pain.  She has tenderness over the left TMJ.  She also has tenderness to percussion to the left lower molars with evidence of a dental carry present.  No associated abscess noted.  No lymphadenopathy.  No evidence of otitis media on exam.  I suspect that the main cause of her symptoms is likely TMJ dysfunction given her history of teeth grinding.  I have recommended getting a bite guard and will also give a prescription for anti-inflammatories for home.  Given her dental caries and tooth pain to the left lower mouth we will also prescribe Augmentin to cover for potential dental infection.  This will also cover potential sinus infection as well given her congestion for the last week and tenderness to the left maxillary sinus.  She was given a dose of pain medications in the ED.  I also gave her resources for some local dentist to follow-up with in regards to the dental infection and TMJ pain.  Advised on strict return precautions.  She voiced understanding of the plan and reasons to return.  All questions answered.  Patient stable for discharge. ? ?Final Clinical Impression(s) / ED Diagnoses ?Final diagnoses:  ?Pain, dental  ?TMJ (temporomandibular joint syndrome)  ? ? ?Rx / DC Orders ?ED Discharge Orders   ? ?      Ordered  ?  amoxicillin-clavulanate (AUGMENTIN) 875-125 MG tablet  2 times daily       ? 05/24/21 1652  ?  fluconazole (DIFLUCAN) 150 MG tablet       ? 05/24/21 1652  ?  naproxen (NAPROSYN) 500 MG tablet  2 times daily        ? 05/24/21 1652  ? ?  ?  ? ?  ? ? ?  ?Karrie Meres, PA-C ?05/24/21 1722 ? ?  ?Gwyneth Sprout, MD ?05/24/21 2201 ? ?

## 2021-05-24 NOTE — ED Notes (Signed)
Has been having a headache with bilateral eye pain x 2 weeks, has tried OTC without relief, mae x 4, ambulates without assistance, gait steady ?

## 2021-08-04 ENCOUNTER — Other Ambulatory Visit: Payer: Self-pay

## 2021-08-04 ENCOUNTER — Encounter (HOSPITAL_BASED_OUTPATIENT_CLINIC_OR_DEPARTMENT_OTHER): Payer: Self-pay

## 2021-08-04 ENCOUNTER — Emergency Department (HOSPITAL_BASED_OUTPATIENT_CLINIC_OR_DEPARTMENT_OTHER)
Admission: EM | Admit: 2021-08-04 | Discharge: 2021-08-04 | Disposition: A | Discharge status: Home / Self Care | Payer: Self-pay | Attending: Emergency Medicine | Admitting: Emergency Medicine

## 2021-08-04 DIAGNOSIS — K029 Dental caries, unspecified: Secondary | ICD-10-CM

## 2021-08-04 DIAGNOSIS — H9202 Otalgia, left ear: Secondary | ICD-10-CM | POA: Insufficient documentation

## 2021-08-04 MED ORDER — FLUCONAZOLE 150 MG PO TABS: ORAL_TABLET | ORAL | 0 refills | Status: DC

## 2021-08-04 MED ORDER — IBUPROFEN 400 MG PO TABS
600.0000 mg | ORAL_TABLET | Freq: Once | ORAL | Status: AC
  Administered 2021-08-04: 600 mg via ORAL
  Filled 2021-08-04: qty 1

## 2021-08-04 MED ORDER — AMOXICILLIN-POT CLAVULANATE 875-125 MG PO TABS: 1.0000 | ORAL_TABLET | Freq: Two times a day (BID) | ORAL | 0 refills | Status: DC

## 2021-08-04 NOTE — Discharge Instructions (Addendum)
It was a pleasure taking care of you today!   You will be prescribed Augmentin, take as directed and ensure to complete the entire course of the antibiotic. You may take over the counter 600 mg Ibuprofen every 6 hours or 1,000 mg Tylenol every 6 hours as directed.  Attached you will find a resource guide for dentists in the area, call and set up a follow up appointment. Return to the Emergency Department if you are experiencing trouble breathing, trouble swallowing, decreased fluid intake, chest pain, or worsening symptoms.   Dental Assistance:  If unable to pay or uninsured, contact: [facility]. to become qualified for the adult dental clinic. Patient must be enrolled in [facility] (uninsured, 0-200% FPL, qualifying info).  Enroll in [facility] first, then see Primary Care Physician assigned to you, the PCP makes a dental referral. [facility] will receive referral and contacts patient for appointment.  Patients with Medicaid           [address] [phone]  [facility] (Children up to 20 + Pregnant Women) - [phone]  [facility] - [address] - [phone]  If unable to pay, or uninsured: contact [facility] Department [phone] in [place] - (Children only + Pregnant Women), [phone] in [facility]- Children only) to become qualified for the adult dental clinic  Must see if eligible to enroll in [facility] Marketplace before enrolling into the [facility] (exemption required) [phone] for an appointment)  [email or web address];   [phone].  If not eligible for ACA, then go by Department of Health and Human Services to see if eligible for "orange card."  [address], [place] and [phone]  Once you get an orange card, you will have a Primary Care home who will then refer you to dental if needed.     Other IT consultant:   [facility] [phone] (ext [phone])   [address]  Dr. Lawrence Marseilles - [phone]   [address]    [place] - [phone]   [facility]           [address] [place], [place], [place], [address]           [phone], Ext. [phone]           2nd and 4th Thursday of the month at 6:30am (Simple extractions only - no wisdom teeth or surgery) First come/First serve -First 10 clients served           [facility] [place], [place] and [place] residents only)          [address] [place], [place], [address]           [phone]                    [facility] Health Department           [phone]          [facility] Health Department          [phone]         [facility] Health Department - Children[facility] Dental [facility]          [phone] No Primary Care Doctor:  To locate a primary care doctor that accepts your insurance or provides certain services:  [facility]: [phone]           Physician Referral Service: [phone] ask for "My [place]" If no insurance, you need to see if you qualify for [facility] "orange card", call to set      up appointment for eligibility/enrollment at [phone] or [phone] or visit [facility]. of Health and [employer] ([address], [place] and [address] -[place]) to meet with a [facility] enrollment specialist.  Agencies that provide inexpensive (sliding fee scale) medical care:      [facility] - Family Medicine at [place] - [phone]    [facility]  -  [facility] [phone]    [facility] Internal Medicine - [phone]    [facility] - [phone]    [facility] [phone]    [facility] Family Practice [phone] [facility] - [facility] Child Health @ [place] [phone] [facility] - [facility] Health @  [place] - [phone] [facility] Family Practice: [phone]  Women's Clinic: [phone]  Planned Parenthood: [phone]  [facility]    Medicaid-accepting [facility] Providers:           [facility] [phone] (No Family Planning accepted)          [address], [phone], Mon-Fri 9am-5pm          [facility] - [phone] [address] [place], [address] [place], Mon-Thursday 8am-5pm, Fri 8am-noon [employer] - [phone]          [address], Mon-Fri 7:30am-4:30pm          [facility] - [phone]          [address] [place] 8am-5pm          [facility] - [phone] [address]          Only accepts [place] [employer] patients after they have their name applied to their card  Self Pay (no insurance) in [facility]:           Sickle Cell Patients:  [address] [place], [phone] [facility] Internal Medicine: [address], [place] [phone]       [facility] and Wellness [address], [place] [phone]  [facility] Family Practice: [address] [phone]          [facility] Care           [address] [place], [phone] [facility] [address] [place], [phone]           [facility] [place]           [address], [place] [phone]        [facility] - [address]           [phone], Mon-Fri 9am-7pm, [place] 9am-1pm          [facility] - Family Medicine @ [facility]          [address] [place], [phone]  [facility] [place]           [address], [phone] [facility] - [facility] [place] [address], [place] [phone]          [facility]           [address] [place] [address], [phone]  [facility] - [facility]  [address], [phone] [facility] - [facility] [address], [phone]  Dr. Julio Sickssei-Bonsu           [address], [place], [phone]          [facility]           [address], [phone]          [facility] [place]             [address], [phone]          [facility]           [address] [place], [phone], 1st & 3rd Saturday every month, 10am-1pm OTHERS:  [employer]  (Immigration [employer]ccess Clinic Only)  [phone] (Thursday only) Strategies for finding a Primary Care Provider:  1) Find a Doctor and Pay Out of Pocket  Although you won't have to find out who is covered by your insurance plan, it is a good idea to ask around and get recommendations. You will then need to call the office and see if the doctor you have chosen will accept you as a new patient and what types of options they offer for patients who are self-pay. Some doctors offer discounts or will set up payment plans for their patients who do not have insurance, but you will need to ask so you aren't surprised when you get to your appointment.  2) Contact [facility] - To see if you qualify for "orange card" access to healthcare safety net providers.  Call for appointment for eligibility/enrollment at [phone] or [phone]. (Uninsured, 0-200% FPL, qualifying info)  Applicants for [facility] are first required to see if they are eligible to enroll in the [facility] Marketplace before enrolling in [facility] (and get an exemption if they are not).  GCCN Criteria for acceptance is:  Proof of Engineering geologist[employer] Marketing exemption - form or documentation  Valid photo ID (driver's license, state identification card, passport, home country ID)  Proof of  [facility] residency (e.g. driver's license, lease/landlord information, pay stubs with address, utility bill, bank statement, etc.)  Proof of income (1040, last year's tax return, W2, 4 current pay stubs, other income proof)  Proof of assets (current bank statement + 3 most recent, disability paperwork, life insurance info, tax value on autos, etc.)  3) Contact Your Local Health Department  Not all health departments have doctors that can see patients for sick visits, but many do, so it is worth a call to see if yours does. If you don't know where your local health department is, you can check in your phone book. The CDC also has a tool to help you locate your state's health department, and many state websites also have listings of all of their local health departments.

## 2021-08-04 NOTE — ED Provider Notes (Signed)
[facility] EMERGENCY DEPARTMENT Provider Note   CSN: [id number] Arrival date & time: 08/04/21  1914     History  Chief Complaint  Patient presents with   Otalgia    Mary Kidd is a 41 y.o. female who presents to the Emergency Department complaining of left ear pain onset 2 weeks.  Notes that her left ear pain radiates to the left jaw.  She has a bad tooth to the left lower side as well.  Denies tooth pain or having a dentist at this time.  Has tried Tylenol at home with no relief for her symptoms.  Denies ear discharge, sore throat, trouble swallowing, fever, chills, nausea, vomiting.  Per pt chart review: Pt was evaluated in the ED on March 2023 for similar symptoms. At the time, pt was treated with     The history is provided by the patient. No language interpreter was used.      Home Medications Prior to Admission medications   Medication Sig Start Date End Date Taking? Authorizing Provider  amoxicillin-clavulanate (AUGMENTIN) 875-125 MG tablet Take 1 tablet by mouth every 12 (twelve) hours. 08/04/21  Yes Toyia Jelinek A, PA-C  azithromycin (ZITHROMAX) 250 MG tablet Take 1 tablet (250 mg total) by mouth daily. 04/11/20   Gilda CreasePollina, Christopher J, MD  fluconazole (DIFLUCAN) 150 MG tablet Take one tablet on the day you fill the prescription. If you continue to have symptoms then take the second tablet in 3 days. 08/04/21   Berkeley Veldman A, PA-C  ibuprofen (ADVIL) 600 MG tablet Take 1 tablet (600 mg total) by mouth every 8 (eight) hours as needed for moderate pain. 11/11/20   Terrilee FilesButler, Michael C, MD  methocarbamol (ROBAXIN) 500 MG tablet Take 1 tablet (500 mg total) by mouth 2 (two) times daily as needed for muscle spasms. 11/11/20   Terrilee FilesButler, Michael C, MD  mometasone (NASONEX) 50 MCG/ACT nasal spray Place 2 sprays into the nose daily. 03/21/20   Gilda CreasePollina, Christopher J, MD  naproxen (NAPROSYN) 375 MG tablet Take 1 tablet twice daily for pain. 11/15/18   Molpus, John, MD  predniSONE  (DELTASONE) 20 MG tablet Take 2 tablets (40 mg total) by mouth daily with breakfast. 04/11/20   Pollina, Canary Brimhristopher J, MD      Allergies    Penicillins    Review of Systems   Review of Systems  Constitutional:  Negative for chills and fever.  HENT:  Positive for ear pain. Negative for dental problem.   Respiratory:  Negative for shortness of breath.   Gastrointestinal:  Negative for nausea and vomiting.  Neurological:  Positive for headaches.  All other systems reviewed and are negative.  Physical Exam Updated Vital Signs BP 131/79 (BP Location: Right Arm)   Pulse 79   Temp 98.1 F (36.7 C) (Oral)   Resp 16   Ht 4\' 7"  (1.397 m)   Wt 78 kg   LMP 07/28/2021 (Exact Date)   SpO2 99%   BMI 39.98 kg/m  Physical Exam Vitals and nursing note reviewed.  Constitutional:      General: She is not in acute distress.    Appearance: She is not ill-appearing or diaphoretic.  HENT:     Head: Normocephalic and atraumatic.     Right Ear: Tympanic membrane, ear canal and external ear normal.     Left Ear: Tympanic membrane, ear canal and external ear normal.     Nose: Nose normal.     Mouth/Throat:     Mouth: Mucous  membranes are moist. Mucous membranes are not dry.     Dentition: Abnormal dentition. Dental caries present. No dental tenderness.     Tongue: Tongue does not deviate from midline.     Pharynx: Oropharynx is clear. Uvula midline. No oropharyngeal exudate, posterior oropharyngeal erythema or uvula swelling.     Tonsils: No tonsillar exudate or tonsillar abscesses.     Comments: Dental caries noted to the left lower gumline. No tenderness to palpation to gumline.  No fluctuance noted.  No trismus.  No peritonsillar abscess.  Patent airway, uvula midline without swelling.  Eyes:     General: No scleral icterus.    Extraocular Movements: Extraocular movements intact.     Conjunctiva/sclera: Conjunctivae normal.  Cardiovascular:     Rate and Rhythm: Normal rate and regular  rhythm.     Pulses: Normal pulses.     Heart sounds: Normal heart sounds.  Pulmonary:     Effort: Pulmonary effort is normal. No respiratory distress.     Breath sounds: Normal breath sounds. No wheezing.  Abdominal:     General: Bowel sounds are normal. There is no distension.     Palpations: Abdomen is soft. There is no mass.     Tenderness: There is no abdominal tenderness. There is no guarding or rebound.  Musculoskeletal:        General: Normal range of motion.     Cervical back: Normal range of motion and neck supple.  Lymphadenopathy:     Head:     Right side of head: No submental, submandibular, tonsillar, preauricular or posterior auricular adenopathy.     Left side of head: No submental, submandibular, tonsillar, preauricular or posterior auricular adenopathy.     Cervical: No cervical adenopathy.  Skin:    General: Skin is warm and dry.     Findings: No rash.  Neurological:     Mental Status: She is alert.  Psychiatric:        Behavior: Behavior normal.    ED Results / Procedures / Treatments   Labs (all labs ordered are listed, but only abnormal results are displayed) Labs Reviewed - No data to display  EKG None  Radiology No results found.  Procedures Procedures    Medications Ordered in ED Medications  ibuprofen (ADVIL) tablet 600 mg (600 mg Oral Given 08/04/21 2124)    ED Course/ Medical Decision Making/ A&P                            Medical Decision Making Risk Prescription drug management.   Patient presents with left ear pain onset 2 weeks.  She notes she has a bad tooth to the left lower side as well.  Denies dental pain, fever, chills, trouble swallowing, nausea, vomiting.  Vital signs stable, patient afebrile.  Differential diagnosis notable for dental caries noted to left lower molar.  No tenderness to palpation noted to gumline, no fluctuance, no trismus, no peritonsillar abscess.  Patent airway uvula midline without swelling.  No acute  cardiovascular or respiratory exam findings.  Denies sick contacts. Differential diagnosis includes dentalgia, otitis media, otitis externa, acute mastoiditis,.    Additional history obtained:  External records from outside source obtained and reviewed including: Patient was evaluated in the ED for similar symptoms in March 2023.  At that time was sent on prescription for antibiotics and given guide for dentists in the area.   Medications:  I ordered medication including ibuprofen for symptom  management I have reviewed the patients home medicines and have made adjustments as needed  Social determinants of health: Patient without a primary care provider or dentist at this time  Disposition: Presentation suspicious for dentalgia causing left ear pain.  Doubt otitis media, otitis externa, acute mastoiditis, meningitis at this time. After consideration of the diagnostic results and the patients response to treatment, I feel that the patient would benefit from Discharge home.  Patient discharged home with a prescription for Augmentin as well as Diflucan today.  Patient provided with resource guide for local dentist and primary care providers in the area. Supportive care measures and strict return precautions discussed with patient at bedside. Pt acknowledges and verbalizes understanding. Pt appears safe for discharge. Follow up as indicated in discharge paperwork.    This chart was dictated using voice recognition software, Dragon. Despite the best efforts of this provider to proofread and correct errors, errors may still occur which can change documentation meaning.   Final Clinical Impression(s) / ED Diagnoses Final diagnoses:  Dental caries    Rx / DC Orders ED Discharge Orders          Ordered    amoxicillin-clavulanate (AUGMENTIN) 875-125 MG tablet  Every 12 hours        08/04/21 2121    fluconazole (DIFLUCAN) 150 MG tablet        08/04/21 2121              Cypress Fanfan A,  PA-C 08/04/21 2131    Vanetta Mulders, MD 08/21/21 1529

## 2021-08-04 NOTE — ED Triage Notes (Signed)
Pt presents to ED from home C/O L ear pain radiating to L jaw X 2 weeks. Pt reports a "bad tooth" on L side as well. Denies tooth pain.

## 2021-08-04 NOTE — ED Notes (Signed)
Pt verbalizes understanding of discharge instructions. Opportunity for questioning and answers were provided. Pt discharged from ED to home.   ? ?

## 2022-02-12 ENCOUNTER — Other Ambulatory Visit: Payer: Self-pay

## 2022-02-12 ENCOUNTER — Encounter (HOSPITAL_BASED_OUTPATIENT_CLINIC_OR_DEPARTMENT_OTHER): Payer: Self-pay | Admitting: Emergency Medicine

## 2022-02-12 ENCOUNTER — Emergency Department (HOSPITAL_BASED_OUTPATIENT_CLINIC_OR_DEPARTMENT_OTHER)
Admission: EM | Admit: 2022-02-12 | Discharge: 2022-02-12 | Disposition: A | Discharge status: Home / Self Care | Payer: 59 | Attending: Student | Admitting: Student

## 2022-02-12 DIAGNOSIS — Z1152 Encounter for screening for COVID-19: Secondary | ICD-10-CM | POA: Insufficient documentation

## 2022-02-12 DIAGNOSIS — B001 Herpesviral vesicular dermatitis: Secondary | ICD-10-CM | POA: Diagnosis not present

## 2022-02-12 DIAGNOSIS — J029 Acute pharyngitis, unspecified: Secondary | ICD-10-CM | POA: Diagnosis present

## 2022-02-12 LAB — RESP PANEL BY RT-PCR (FLU A&B, COVID) ARPGX2
Influenza A by PCR: NEGATIVE
Influenza B by PCR: NEGATIVE
SARS Coronavirus 2 by RT PCR: NEGATIVE

## 2022-02-12 LAB — GROUP A STREP BY PCR: Group A Strep by PCR: NOT DETECTED

## 2022-02-12 MED ORDER — LIDOCAINE VISCOUS HCL 2 % MT SOLN
15.0000 mL | Freq: Once | OROMUCOSAL | Status: AC
  Administered 2022-02-12: 15 mL via ORAL
  Filled 2022-02-12: qty 15

## 2022-02-12 MED ORDER — VALACYCLOVIR HCL 1 G PO TABS: 1000.0000 mg | ORAL_TABLET | Freq: Two times a day (BID) | ORAL | 0 refills | Status: AC

## 2022-02-12 MED ORDER — ALUM & MAG HYDROXIDE-SIMETH 200-200-20 MG/5ML PO SUSP
30.0000 mL | Freq: Once | ORAL | Status: AC
Start: 2022-02-12 — End: 2022-02-12
  Administered 2022-02-12: 30 mL via ORAL
  Filled 2022-02-12: qty 30

## 2022-02-12 NOTE — ED Notes (Signed)
Patient verbalized understanding of discharge instructions and reasons to return to the ED 

## 2022-02-12 NOTE — Discharge Instructions (Addendum)
You have been seen today for your complaint of sore throat. Your lab work was reassuring and showed no abnormalities. Your discharge medications include Valtrex.  You should take it as prescribed for your cold sore. Follow up with: Your primary care provider in 1 week Please seek immediate medical care if you develop any of the following symptoms: You have difficulty breathing. You cannot swallow fluids, soft foods, or your saliva. You have increased swelling in your throat or neck. You have persistent nausea and vomiting. At this time there does not appear to be the presence of an emergent medical condition, however there is always the potential for conditions to change. Please read and follow the below instructions.  Do not take your medicine if  develop an itchy rash, swelling in your mouth or lips, or difficulty breathing; call 911 and seek immediate emergency medical attention if this occurs.  You may review your lab tests and imaging results in their entirety on your MyChart account.  Please discuss all results of fully with your primary care provider and other specialist at your follow-up visit.  Note: Portions of this text may have been transcribed using voice recognition software. Every effort was made to ensure accuracy; however, inadvertent computerized transcription errors may still be present.

## 2022-02-12 NOTE — ED Provider Notes (Signed)
[facility] EMERGENCY DEPARTMENT Provider Note   CSN: [id number] Arrival date & time: 02/12/22  1328     History  Chief Complaint  Patient presents with   Cough    Mary Kidd is a 41 y.o. female.  No significant past medical history presents the ED for evaluation of sore throat.  She states that the day after Thanksgiving she states eggs that she believes were bad.  She spent the rest of the day vomiting.  She felt her throat gets sore after her numerous episodes of vomiting.  She then woke up the next morning with an increase of her sore throat.  Has tried throat lozenges with minimal success.  Pain has slowly gotten better, but is still present.  States her mouth feels very dry.  Has been drinking soda daily.  Reports she is a daily smoker.  States she is going to quit now that she knows that smoking is not good for her throat. Denies shortness of breath. Has a rare nonproductive cough.  She presents today due to concern for cancer because she googled her symptoms.   Cough Associated symptoms: sore throat        Home Medications Prior to Admission medications   Medication Sig Start Date End Date Taking? Authorizing Provider  valACYclovir (VALTREX) 1000 MG tablet Take 1 tablet (1,000 mg total) by mouth 2 (two) times daily. 02/12/22  Yes Venera Privott, Edsel Petrin, PA-C  amoxicillin-clavulanate (AUGMENTIN) 875-125 MG tablet Take 1 tablet by mouth every 12 (twelve) hours. 08/04/21   Blue, Soijett A, PA-C  azithromycin (ZITHROMAX) 250 MG tablet Take 1 tablet (250 mg total) by mouth daily. 04/11/20   Gilda Crease, MD  fluconazole (DIFLUCAN) 150 MG tablet Take one tablet on the day you fill the prescription. If you continue to have symptoms then take the second tablet in 3 days. 08/04/21   Blue, Soijett A, PA-C  ibuprofen (ADVIL) 600 MG tablet Take 1 tablet (600 mg total) by mouth every 8 (eight) hours as needed for moderate pain. 11/11/20   Terrilee Files, MD  methocarbamol  (ROBAXIN) 500 MG tablet Take 1 tablet (500 mg total) by mouth 2 (two) times daily as needed for muscle spasms. 11/11/20   Terrilee Files, MD  mometasone (NASONEX) 50 MCG/ACT nasal spray Place 2 sprays into the nose daily. 03/21/20   Gilda Crease, MD  naproxen (NAPROSYN) 375 MG tablet Take 1 tablet twice daily for pain. 11/15/18   Molpus, John, MD  predniSONE (DELTASONE) 20 MG tablet Take 2 tablets (40 mg total) by mouth daily with breakfast. 04/11/20   Pollina, Canary Brim, MD      Allergies    Penicillins    Review of Systems   Review of Systems  HENT:  Positive for sore throat.     Physical Exam Updated Vital Signs BP (!) 155/96 (BP Location: Right Arm)   Pulse 99   Temp 98.2 F (36.8 C) (Oral)   Resp 18   Ht 4\' 7"  (1.397 m)   Wt 75.8 kg   LMP 02/10/2022 (Exact Date)   SpO2 96%   BMI 38.81 kg/m  Physical Exam Vitals and nursing note reviewed.  Constitutional:      General: She is not in acute distress.    Appearance: Normal appearance. She is well-developed and normal weight. She is not ill-appearing.  HENT:     Head: Normocephalic and atraumatic.     Mouth/Throat:     Lips: Lesions present.  Mouth: Mucous membranes are moist.     Pharynx: Oropharynx is clear. No oropharyngeal exudate or posterior oropharyngeal erythema.     Tonsils: No tonsillar exudate or tonsillar abscesses. 1+ on the right. 1+ on the left.      Comments: Cold sore like lesion to the left upper lip Eyes:     Conjunctiva/sclera: Conjunctivae normal.  Cardiovascular:     Rate and Rhythm: Normal rate and regular rhythm.     Heart sounds: No murmur heard. Pulmonary:     Effort: Pulmonary effort is normal. No respiratory distress.     Breath sounds: Normal breath sounds. No stridor. No wheezing, rhonchi or rales.  Abdominal:     General: Abdomen is flat.     Palpations: Abdomen is soft.     Tenderness: There is no abdominal tenderness.  Musculoskeletal:        General: No swelling.  Normal range of motion.     Cervical back: Neck supple.  Skin:    General: Skin is warm and dry.     Capillary Refill: Capillary refill takes less than 2 seconds.  Neurological:     Mental Status: She is alert and oriented to person, place, and time.  Psychiatric:        Mood and Affect: Mood normal.        Behavior: Behavior normal.     ED Results / Procedures / Treatments   Labs (all labs ordered are listed, but only abnormal results are displayed) Labs Reviewed  RESP PANEL BY RT-PCR (FLU A&B, COVID) ARPGX2  GROUP A STREP BY PCR    EKG None  Radiology No results found.  Procedures Procedures    Medications Ordered in ED Medications  alum & mag hydroxide-simeth (MAALOX/MYLANTA) 200-200-20 MG/5ML suspension 30 mL (30 mLs Oral Given 02/12/22 1548)    And  lidocaine (XYLOCAINE) 2 % viscous mouth solution 15 mL (15 mLs Oral Given 02/12/22 1548)    ED Course/ Medical Decision Making/ A&P Clinical Course as of 02/12/22 1612  Fri Feb 12, 2022  1608 Symptoms have improved after GI cocktail with lidocaine [AS]    Clinical Course User Index [AS] Michelle Piper, PA-C                           Medical Decision Making This patient presents to the ED for concern of sore throat, this involves an extensive number of treatment options, and is a complaint that carries with it a high risk of complications and morbidity.  The differential diagnosis for sore throat includes viral or bacterial pharyngitis, pharyngeal injury   Co morbidities that complicate the patient evaluation   daily smoker  My initial workup includes GI cocktail including lidocaine  Additional history obtained from: Nursing notes from this visit.  I ordered, reviewed and interpreted labs which include: Respiratory panel, strep.  Negative  Afebrile, hemodynamically stable.  41 year old female presenting to the ED for evaluation of sore throat after numerous episodes of vomiting.  Physical exam  unremarkable thank you.  There is no sign of infection on exam.  Patient is a daily smoker which may be contributing to the issue.  Pain is most likely secondary to emesis.  Was given a GI cocktail with lidocaine and she reported improvement after this.  Patient will be given a handout for sore throat home treatment.  Will also be started on Valtrex for her cold sore.  She does have a history of  these.  This has been present for 2 days.  Encourage patient follow-up with her primary care provider.  Stable at discharge.  At this time there does not appear to be any evidence of an acute emergency medical condition and the patient appears stable for discharge with appropriate outpatient follow up. Diagnosis was discussed with patient who verbalizes understanding of care plan and is agreeable to discharge. I have discussed return precautions with patient who verbalizes understanding. Patient encouraged to follow-up with their PCP within 1 week. All questions answered.  Note: Portions of this report may have been transcribed using voice recognition software. Every effort was made to ensure accuracy; however, inadvertent computerized transcription errors may still be present.          Final Clinical Impression(s) / ED Diagnoses Final diagnoses:  Sore throat  Cold sore    Rx / DC Orders ED Discharge Orders          Ordered    valACYclovir (VALTREX) 1000 MG tablet  2 times daily        02/12/22 1609              Michelle Piper, Cordelia Poche 02/12/22 1612    Long, Arlyss Repress, MD 02/18/22 1520

## 2022-02-12 NOTE — ED Triage Notes (Signed)
Sore throat and cough x 1 week , no fever .

## 2022-03-07 ENCOUNTER — Encounter (HOSPITAL_BASED_OUTPATIENT_CLINIC_OR_DEPARTMENT_OTHER): Payer: Self-pay | Admitting: Emergency Medicine

## 2022-03-07 ENCOUNTER — Other Ambulatory Visit: Payer: Self-pay

## 2022-03-07 DIAGNOSIS — J029 Acute pharyngitis, unspecified: Secondary | ICD-10-CM | POA: Diagnosis present

## 2022-03-07 DIAGNOSIS — J069 Acute upper respiratory infection, unspecified: Secondary | ICD-10-CM | POA: Insufficient documentation

## 2022-03-07 NOTE — ED Triage Notes (Signed)
Sore throat, pain behind ears, Sinus issues, seen for same on 12/01.

## 2022-03-08 ENCOUNTER — Emergency Department (HOSPITAL_BASED_OUTPATIENT_CLINIC_OR_DEPARTMENT_OTHER)
Admission: EM | Admit: 2022-03-08 | Discharge: 2022-03-08 | Disposition: A | Discharge status: Home / Self Care | Payer: Commercial Managed Care - HMO | Attending: Emergency Medicine | Admitting: Emergency Medicine

## 2022-03-08 DIAGNOSIS — J069 Acute upper respiratory infection, unspecified: Secondary | ICD-10-CM

## 2022-03-08 MED ORDER — FLUTICASONE PROPIONATE 50 MCG/ACT NA SUSP: 1.0000 | Freq: Every day | NASAL | 2 refills | Status: AC

## 2022-03-08 MED ORDER — OXYMETAZOLINE HCL 0.05 % NA SOLN
1.0000 | Freq: Once | NASAL | Status: AC
  Administered 2022-03-08: 1 via NASAL
  Filled 2022-03-08: qty 30

## 2022-03-08 NOTE — ED Provider Notes (Signed)
[facility] EMERGENCY DEPARTMENT Provider Note   CSN: [id number] Arrival date & time: 03/07/22  2331     History  Chief Complaint  Patient presents with   URI   Sore Throat    Mary Kidd is a 41 y.o. female.  The history is provided by the patient.  URI Sore Throat  Mary Kidd is a 41 y.o. female who presents to the Emergency Department complaining of sinus issues.  She states that she has been sick since November 29.  She complains of stuffy and dry nose with pain across her nasal bridge.  Initially she did have a sore throat but now it is mostly resolved.  Her throat feels scratchy and sometimes her voice is hoarse.  She also had a pretty significant cough initially but overall this is improving as well.  She only gets short of breath if she has a severe coughing spell. Dry nose and throat. Loses voice some times.  She was evaluated in the emergency department on December 1 and had a negative strep and respiratory value panel at that time.  No chest pain.  No N/V/D.  No leg swelling/pain.    No significant medical problems.  No routine medications.   Smokes tobacco.  Has decreased since she has been sick.  No alcohol.  No street drugs.    No chance of pregnancy.       Home Medications Prior to Admission medications   Medication Sig Start Date End Date Taking? Authorizing Provider  fluticasone (FLONASE) 50 MCG/ACT nasal spray Place 1 spray into both nostrils daily. 03/08/22  Yes Tilden Fossa, MD  amoxicillin-clavulanate (AUGMENTIN) 875-125 MG tablet Take 1 tablet by mouth every 12 (twelve) hours. 08/04/21   Blue, Soijett A, PA-C  azithromycin (ZITHROMAX) 250 MG tablet Take 1 tablet (250 mg total) by mouth daily. 04/11/20   Gilda Crease, MD  fluconazole (DIFLUCAN) 150 MG tablet Take one tablet on the day you fill the prescription. If you continue to have symptoms then take the second tablet in 3 days. 08/04/21   Blue, Soijett A, PA-C  ibuprofen  (ADVIL) 600 MG tablet Take 1 tablet (600 mg total) by mouth every 8 (eight) hours as needed for moderate pain. 11/11/20   Terrilee Files, MD  methocarbamol (ROBAXIN) 500 MG tablet Take 1 tablet (500 mg total) by mouth 2 (two) times daily as needed for muscle spasms. 11/11/20   Terrilee Files, MD  mometasone (NASONEX) 50 MCG/ACT nasal spray Place 2 sprays into the nose daily. 03/21/20   Gilda Crease, MD  naproxen (NAPROSYN) 375 MG tablet Take 1 tablet twice daily for pain. 11/15/18   Molpus, Jonny Ruiz, MD  predniSONE (DELTASONE) 20 MG tablet Take 2 tablets (40 mg total) by mouth daily with breakfast. 04/11/20   Pollina, Canary Brim, MD  valACYclovir (VALTREX) 1000 MG tablet Take 1 tablet (1,000 mg total) by mouth 2 (two) times daily. 02/12/22   Schutt, Edsel Petrin, PA-C      Allergies    Penicillins    Review of Systems   Review of Systems  All other systems reviewed and are negative.   Physical Exam Updated Vital Signs BP (!) 129/90 (BP Location: Right Arm)   Pulse 79   Temp 98.2 F (36.8 C) (Oral)   Resp 20   Ht 4\' 7"  (1.397 m)   Wt 75.8 kg   LMP 02/10/2022 (Exact Date)   SpO2 99%   BMI 38.81 kg/m  Physical Exam Vitals  and nursing note reviewed.  Constitutional:      Appearance: She is well-developed.  HENT:     Head: Normocephalic and atraumatic.     Comments: Mild erythema of the left TM without effuse    Nose:     Comments: Boggy nasal mucosa    Mouth/Throat:     Mouth: Mucous membranes are moist.     Pharynx: No oropharyngeal exudate or posterior oropharyngeal erythema.  Cardiovascular:     Rate and Rhythm: Normal rate and regular rhythm.     Heart sounds: No murmur heard. Pulmonary:     Effort: Pulmonary effort is normal. No respiratory distress.     Breath sounds: Normal breath sounds.  Musculoskeletal:        General: No tenderness.     Cervical back: Neck supple.  Lymphadenopathy:     Cervical: No cervical adenopathy.  Skin:    General: Skin is warm  and dry.  Neurological:     Mental Status: She is alert and oriented to person, place, and time.  Psychiatric:        Behavior: Behavior normal.     ED Results / Procedures / Treatments   Labs (all labs ordered are listed, but only abnormal results are displayed) Labs Reviewed - No data to display  EKG None  Radiology No results found.  Procedures Procedures    Medications Ordered in ED Medications  oxymetazoline (AFRIN) 0.05 % nasal spray 1 spray (has no administration in time range)    ED Course/ Medical Decision Making/ A&P                           Medical Decision Making Risk OTC drugs.   Patient here for nasal congestion, dry nose, scratchy throat.  She is nontoxic-appearing on evaluation.  No evidence of otitis media, strep pharyngitis, pneumonia.  Current clinical picture is not consistent with acute bacterial infection.  Discussed home care for viral illness with persistent nasal symptoms.  Discussed continuing her OTC agents such as cough drops, tea and warm water.  Discussed outpatient follow-up.        Final Clinical Impression(s) / ED Diagnoses Final diagnoses:  Viral upper respiratory tract infection    Rx / DC Orders ED Discharge Orders          Ordered    fluticasone (FLONASE) 50 MCG/ACT nasal spray  Daily        03/08/22 0204              Tilden Fossa, MD 03/08/22 [phone]

## 2022-10-23 ENCOUNTER — Emergency Department (HOSPITAL_BASED_OUTPATIENT_CLINIC_OR_DEPARTMENT_OTHER)
Admission: EM | Admit: 2022-10-23 | Discharge: 2022-10-23 | Disposition: A | Discharge status: Home / Self Care | Payer: 59 | Attending: Emergency Medicine | Admitting: Emergency Medicine

## 2022-10-23 ENCOUNTER — Encounter (HOSPITAL_BASED_OUTPATIENT_CLINIC_OR_DEPARTMENT_OTHER): Payer: Self-pay | Admitting: Emergency Medicine

## 2022-10-23 ENCOUNTER — Other Ambulatory Visit: Payer: Self-pay

## 2022-10-23 DIAGNOSIS — K029 Dental caries, unspecified: Secondary | ICD-10-CM

## 2022-10-23 DIAGNOSIS — K0889 Other specified disorders of teeth and supporting structures: Secondary | ICD-10-CM | POA: Diagnosis present

## 2022-10-23 MED ORDER — AMOXICILLIN-POT CLAVULANATE 875-125 MG PO TABS: 1.0000 | ORAL_TABLET | Freq: Two times a day (BID) | ORAL | 0 refills | Status: DC

## 2022-10-23 MED ORDER — KETOROLAC TROMETHAMINE 30 MG/ML IJ SOLN
30.0000 mg | Freq: Once | INTRAMUSCULAR | Status: AC
Start: 2022-10-23 — End: 2022-10-23
  Administered 2022-10-23: 30 mg via INTRAMUSCULAR
  Filled 2022-10-23: qty 1

## 2022-10-23 MED ORDER — OXYCODONE-ACETAMINOPHEN 5-325 MG PO TABS
1.0000 | ORAL_TABLET | Freq: Once | ORAL | Status: AC
  Administered 2022-10-23: 1 via ORAL
  Filled 2022-10-23: qty 1

## 2022-10-23 MED ORDER — OXYCODONE-ACETAMINOPHEN 5-325 MG PO TABS: 1.0000 | ORAL_TABLET | Freq: Four times a day (QID) | ORAL | 0 refills | Status: AC | PRN

## 2022-10-23 MED ORDER — AMOXICILLIN-POT CLAVULANATE 875-125 MG PO TABS
1.0000 | ORAL_TABLET | Freq: Once | ORAL | Status: AC
  Administered 2022-10-23: 1 via ORAL
  Filled 2022-10-23: qty 1

## 2022-10-23 MED ORDER — FLUCONAZOLE 150 MG PO TABS: 150.0000 mg | ORAL_TABLET | Freq: Once | ORAL | 0 refills | Status: AC

## 2022-10-23 NOTE — Discharge Instructions (Signed)
You have a dental infection.  Please take Augmentin twice daily for a week.  Take Tylenol Motrin for pain and Percocet for severe pain  I have prescribed Diflucan in case you get a yeast infection  You need to call your dentist on Monday for follow-up  Return to ER if you have worse dental pain or throat swelling or trouble swallowing

## 2022-10-23 NOTE — ED Triage Notes (Signed)
Patient here with dental pain, started two days ago.  She states it on the bottom left jaw.

## 2022-10-23 NOTE — ED Provider Notes (Signed)
[place] [facility]MENT AT [facility] Provider Note   CSN: [id number] Arrival date & time: 10/23/22  2149     History  Chief Complaint  Patient presents with   Dental Pain    Mary Kidd is a 42 y.o. female here presenting with dental pain.  Patient has a history of dental infection.  Patient states that she does have a dentist.  She has worsening pain for the last 2 days.  Patient denies trouble swallowing or fevers.  The history is provided by the patient.       Home Medications Prior to Admission medications   Medication Sig Start Date End Date Taking? Authorizing Provider  amoxicillin-clavulanate (AUGMENTIN) 875-125 MG tablet Take 1 tablet by mouth every 12 (twelve) hours. 08/04/21   Blue, Soijett A, PA-C  azithromycin (ZITHROMAX) 250 MG tablet Take 1 tablet (250 mg total) by mouth daily. 04/11/20   Gilda Crease, MD  fluconazole (DIFLUCAN) 150 MG tablet Take one tablet on the day you fill the prescription. If you continue to have symptoms then take the second tablet in 3 days. 08/04/21   Blue, Soijett A, PA-C  fluticasone (FLONASE) 50 MCG/ACT nasal spray Place 1 spray into both nostrils daily. 03/08/22   Tilden Fossa, MD  ibuprofen (ADVIL) 600 MG tablet Take 1 tablet (600 mg total) by mouth every 8 (eight) hours as needed for moderate pain. 11/11/20   Terrilee Files, MD  methocarbamol (ROBAXIN) 500 MG tablet Take 1 tablet (500 mg total) by mouth 2 (two) times daily as needed for muscle spasms. 11/11/20   Terrilee Files, MD  mometasone (NASONEX) 50 MCG/ACT nasal spray Place 2 sprays into the nose daily. 03/21/20   Gilda Crease, MD  naproxen (NAPROSYN) 375 MG tablet Take 1 tablet twice daily for pain. 11/15/18   Molpus, Jonny Ruiz, MD  predniSONE (DELTASONE) 20 MG tablet Take 2 tablets (40 mg total) by mouth daily with breakfast. 04/11/20   Pollina, Canary Brim, MD  valACYclovir (VALTREX) 1000 MG tablet Take 1 tablet (1,000 mg total) by mouth 2  (two) times daily. 02/12/22   Schutt, Edsel Petrin, PA-C      Allergies    Penicillins    Review of Systems   Review of Systems  HENT:  Positive for dental problem.   All other systems reviewed and are negative.   Physical Exam Updated Vital Signs BP (!) 138/98 (BP Location: Left Arm)   Pulse 83   Temp 98.4 F (36.9 C) (Oral)   Resp 18   SpO2 98%  Physical Exam Vitals and nursing note reviewed.  HENT:     Head: Normocephalic.     Nose: Nose normal.     Mouth/Throat:     Comments: Patient has poor dentition overall.  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  There is no obvious periapical abscess.  Patient has soft floor of mouth. Eyes:     Extraocular Movements: Extraocular movements intact.     Pupils: Pupils are equal, round, and reactive to light.  Neck:     Comments: Mild left cervical lymphadenopathy Cardiovascular:     Rate and Rhythm: Normal rate and regular rhythm.     Pulses: Normal pulses.     Heart sounds: Normal heart sounds.  Pulmonary:     Effort: Pulmonary effort is normal.     Breath sounds: Normal breath sounds.  Abdominal:     General: Abdomen is flat.     Palpations: Abdomen is  soft.  Musculoskeletal:        General: Normal range of motion.     Cervical back: Normal range of motion.  Skin:    General: Skin is warm.     Capillary Refill: Capillary refill takes less than 2 seconds.  Neurological:     General: No focal deficit present.     Mental Status: She is alert and oriented to person, place, and time.  Psychiatric:        Mood and Affect: Mood normal.        Behavior: Behavior normal.     ED Results / Procedures / Treatments   Labs (all labs ordered are listed, but only abnormal results are displayed) Labs Reviewed - No data to display  EKG None  Radiology No results found.  Procedures Procedures    Medications Ordered in ED Medications  oxyCODONE-acetaminophen (PERCOCET/ROXICET) 5-325 MG per tablet 1 tablet (has no  administration in time range)  amoxicillin-clavulanate (AUGMENTIN) 875-125 MG per tablet 1 tablet (has no administration in time range)  ketorolac (TORADOL) 30 MG/ML injection 30 mg (has no administration in time range)    ED Course/ Medical Decision Making/ A&P                                 Medical Decision Making Mary Kidd is a 42 y.o. female here presenting with dental pain.  Patient has history of dental caries.  She likely has dental infection.  Patient has a Education officer, community.  Will prescribe Augmentin and pain medicine.  Told her to call her dentist on Monday.   Problems Addressed: Dental caries: chronic illness or injury with exacerbation, progression, or side effects of treatment  Risk Prescription drug management.    Final Clinical Impression(s) / ED Diagnoses Final diagnoses:  None    Rx / DC Orders ED Discharge Orders     None         Charlynne Pander, MD 10/23/22 2254

## 2022-10-25 ENCOUNTER — Emergency Department (HOSPITAL_BASED_OUTPATIENT_CLINIC_OR_DEPARTMENT_OTHER)
Admission: EM | Admit: 2022-10-25 | Discharge: 2022-10-25 | Disposition: A | Discharge status: Home / Self Care | Payer: 59 | Source: Home / Self Care

## 2022-10-25 ENCOUNTER — Emergency Department (HOSPITAL_BASED_OUTPATIENT_CLINIC_OR_DEPARTMENT_OTHER)
Admission: EM | Admit: 2022-10-25 | Discharge: 2022-10-25 | Discharge status: Against Medical Advice | Payer: 59 | Attending: Emergency Medicine | Admitting: Emergency Medicine

## 2022-10-25 ENCOUNTER — Encounter (HOSPITAL_BASED_OUTPATIENT_CLINIC_OR_DEPARTMENT_OTHER): Payer: Self-pay | Admitting: Emergency Medicine

## 2022-10-25 ENCOUNTER — Other Ambulatory Visit: Payer: Self-pay

## 2022-10-25 ENCOUNTER — Encounter (HOSPITAL_BASED_OUTPATIENT_CLINIC_OR_DEPARTMENT_OTHER): Payer: Self-pay

## 2022-10-25 DIAGNOSIS — K029 Dental caries, unspecified: Secondary | ICD-10-CM | POA: Insufficient documentation

## 2022-10-25 DIAGNOSIS — K0889 Other specified disorders of teeth and supporting structures: Secondary | ICD-10-CM | POA: Diagnosis not present

## 2022-10-25 DIAGNOSIS — Z5321 Procedure and treatment not carried out due to patient leaving prior to being seen by health care provider: Secondary | ICD-10-CM | POA: Diagnosis not present

## 2022-10-25 DIAGNOSIS — K047 Periapical abscess without sinus: Secondary | ICD-10-CM | POA: Insufficient documentation

## 2022-10-25 MED ORDER — IBUPROFEN 400 MG PO TABS
400.0000 mg | ORAL_TABLET | Freq: Once | ORAL | Status: AC | PRN
  Administered 2022-10-25: 400 mg via ORAL
  Filled 2022-10-25: qty 1

## 2022-10-25 MED ORDER — IBUPROFEN 800 MG PO TABS: 800.0000 mg | ORAL_TABLET | Freq: Three times a day (TID) | ORAL | 0 refills | Status: AC

## 2022-10-25 MED ORDER — ONDANSETRON 4 MG PO TBDP: 4.0000 mg | ORAL_TABLET | Freq: Three times a day (TID) | ORAL | 0 refills | Status: DC | PRN

## 2022-10-25 MED ORDER — ONDANSETRON 4 MG PO TBDP
4.0000 mg | ORAL_TABLET | Freq: Once | ORAL | Status: AC
Start: 2022-10-25 — End: 2022-10-25
  Administered 2022-10-25: 4 mg via ORAL
  Filled 2022-10-25: qty 1

## 2022-10-25 MED ORDER — KETOROLAC TROMETHAMINE 15 MG/ML IJ SOLN
30.0000 mg | Freq: Once | INTRAMUSCULAR | Status: AC
  Administered 2022-10-25: 30 mg via INTRAMUSCULAR
  Filled 2022-10-25: qty 2

## 2022-10-25 MED ORDER — AMOXICILLIN-POT CLAVULANATE 875-125 MG PO TABS
1.0000 | ORAL_TABLET | Freq: Once | ORAL | Status: AC
Start: 2022-10-25 — End: 2022-10-25
  Administered 2022-10-25: 1 via ORAL
  Filled 2022-10-25: qty 1

## 2022-10-25 NOTE — Discharge Instructions (Addendum)
Try taking the nausea medicine 30 minutes before the pain medication and antibiotics so there is less likelihood of vomiting.  Also try to eat a little bit before you take the medications.  Please follow-up with your dentist as soon as possible.

## 2022-10-25 NOTE — ED Notes (Signed)
Pt was given IM injection and didn't want to sit for evaluation to make sure that she didn't have an allergic reaction. Pt left room quickly and abruptly.

## 2022-10-25 NOTE — ED Triage Notes (Signed)
Pt just seen yesterday for same. 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 Pt with pain to bottom LT jaw.

## 2022-10-25 NOTE — ED Triage Notes (Signed)
Returning to ED after 2 visits for similar symptoms, dental pain . Pt in tears during triage .

## 2022-10-25 NOTE — ED Provider Notes (Signed)
[place] EM[facility] AT [facility] Provider Note   CSN: [id number] Arrival date & time: 10/25/22  7829     History  Chief Complaint  Patient presents with   Dental Pain    Mary Kidd is a 42 y.o. female.  Patient presents to the emergency department today for ongoing dental pain, left lower jaw.  She was seen in the emergency department 2 days ago and prescribed Augmentin and oxycodone.  She states that she has been unable to tolerate these medications due to vomiting.  She continues to have pain.  No difficulty breathing or swallowing.  No fevers.  She notes some left-sided facial swelling.       Home Medications Prior to Admission medications   Medication Sig Start Date End Date Taking? Authorizing Provider  ibuprofen (ADVIL) 800 MG tablet Take 1 tablet (800 mg total) by mouth 3 (three) times daily. 10/25/22  Yes Renne Crigler, PA-C  ondansetron (ZOFRAN-ODT) 4 MG disintegrating tablet Take 1 tablet (4 mg total) by mouth every 8 (eight) hours as needed for nausea or vomiting. 10/25/22  Yes Renne Crigler, PA-C  amoxicillin-clavulanate (AUGMENTIN) 875-125 MG tablet Take 1 tablet by mouth every 12 (twelve) hours. 10/23/22   Charlynne Pander, MD  azithromycin (ZITHROMAX) 250 MG tablet Take 1 tablet (250 mg total) by mouth daily. 04/11/20   Gilda Crease, MD  fluticasone (FLONASE) 50 MCG/ACT nasal spray Place 1 spray into both nostrils daily. 03/08/22   Tilden Fossa, MD  methocarbamol (ROBAXIN) 500 MG tablet Take 1 tablet (500 mg total) by mouth 2 (two) times daily as needed for muscle spasms. 11/11/20   Terrilee Files, MD  mometasone (NASONEX) 50 MCG/ACT nasal spray Place 2 sprays into the nose daily. 03/21/20   Gilda Crease, MD  oxyCODONE-acetaminophen (PERCOCET) 5-325 MG tablet Take 1 tablet by mouth every 6 (six) hours as needed. 10/23/22   Charlynne Pander, MD  predniSONE (DELTASONE) 20 MG tablet Take 2 tablets (40 mg total) by mouth  daily with breakfast. 04/11/20   Pollina, Canary Brim, MD  valACYclovir (VALTREX) 1000 MG tablet Take 1 tablet (1,000 mg total) by mouth 2 (two) times daily. 02/12/22   Schutt, Edsel Petrin, PA-C      Allergies    Penicillins    Review of Systems   Review of Systems  Physical Exam Updated Vital Signs BP (!) 179/94 (BP Location: Left Arm)   Pulse 76   Temp 98.1 F (36.7 C) (Oral)   Resp 20   SpO2 95%  Physical Exam Vitals and nursing note reviewed.  Constitutional:      Appearance: She is well-developed.     Comments: Patient tearful, frustrated by ongoing pain  HENT:     Head: Normocephalic and atraumatic.     Jaw: No trismus.     Right Ear: Tympanic membrane, ear canal and external ear normal.     Left Ear: Tympanic membrane, ear canal and external ear normal.     Nose: Nose normal.     Mouth/Throat:     Dentition: Abnormal dentition. Dental caries present. No dental abscesses.     Pharynx: Uvula midline. No uvula swelling.     Tonsils: No tonsillar abscesses.     Comments: Patient with trace edema overlying the left lower jaw/mandible.  First molar is broken to the gumline.  No gross abscess. Eyes:     Conjunctiva/sclera: Conjunctivae normal.  Neck:     Comments: No neck swelling or Ludwig's  angina Musculoskeletal:     Cervical back: Normal range of motion and neck supple.  Lymphadenopathy:     Cervical: No cervical adenopathy.  Skin:    General: Skin is warm and dry.  Neurological:     Mental Status: She is alert.     ED Results / Procedures / Treatments   Labs (all labs ordered are listed, but only abnormal results are displayed) Labs Reviewed - No data to display  EKG None  Radiology No results found.  Procedures Procedures    Medications Ordered in ED Medications  ketorolac (TORADOL) 15 MG/ML injection 30 mg (has no administration in time range)  ondansetron (ZOFRAN-ODT) disintegrating tablet 4 mg (has no administration in time range)   amoxicillin-clavulanate (AUGMENTIN) 875-125 MG per tablet 1 tablet (has no administration in time range)    ED Course/ Medical Decision Making/ A&P    Patient seen and examined. History obtained directly from patient.   Labs/EKG: None ordered.  Imaging: None ordered.  Medications/Fluids: Ordered: Zofran, Augmentin, IM Toradol  Most recent vital signs reviewed and are as follows: BP (!) 179/94 (BP Location: Left Arm)   Pulse 76   Temp 98.1 F (36.7 C) (Oral)   Resp 20   SpO2 95%   Initial impression: dental pain/dental infection  Plan: Discharge to home.   Prescriptions written for: Zofran ODT, ibuprofen 800 mg  Other home care instructions discussed: Discussed importance of keeping down the antibiotic and clearing this infection.  I encouraged her to take ODT Zofran 30 minutes before trying to take her medications and try to eat a little bit with the medication as well to minimize stomach upset.  Avoidance of chewing or other activities that makes the symptoms worse. Eat soft foods if needed and maintain good hydration.   ED return instructions discussed: Encouraged patient to return with worsening facial or neck swelling, difficulty breathing or swallowing, fever.   Follow-up instructions discussed: Patient encouraged to follow-up with provided dental referral -- or their own dentist if able.                                 Medical Decision Making Risk Prescription drug management.   Patient presents for dental pain. They do not have a fever and do not appear septic. Exam unconcerning for Ludwig's angina or other deep tissue infection in neck and I do not feel that advanced imaging is indicated at this time. Low suspicion for PTA, RPA, epiglottis based on exam.   Patient will continue treatment for dental infection with antibiotic. Encouraged tylenol/NSAIDs as prescribed or as directed on the packaging for pain. She was prescribed 10 percocet at last visit and states  that she has taken about 4.  Encouraged follow-up with a dentist for definitive and long-term management.          Final Clinical Impression(s) / ED Diagnoses Final diagnoses:  Dental infection    Rx / DC Orders ED Discharge Orders          Ordered    ondansetron (ZOFRAN-ODT) 4 MG disintegrating tablet  Every 8 hours PRN        10/25/22 1041    ibuprofen (ADVIL) 800 MG tablet  3 times daily        10/25/22 1042              Renne Crigler, PA-C 10/25/22 1046    Coral Spikes, DO 10/25/22 1520

## 2023-04-24 ENCOUNTER — Encounter (HOSPITAL_BASED_OUTPATIENT_CLINIC_OR_DEPARTMENT_OTHER): Payer: Self-pay | Admitting: Emergency Medicine

## 2023-04-24 ENCOUNTER — Emergency Department (HOSPITAL_BASED_OUTPATIENT_CLINIC_OR_DEPARTMENT_OTHER)
Admission: EM | Admit: 2023-04-24 | Discharge: 2023-04-24 | Disposition: A | Discharge status: Home / Self Care | Payer: 59 | Attending: Emergency Medicine | Admitting: Emergency Medicine

## 2023-04-24 ENCOUNTER — Other Ambulatory Visit: Payer: Self-pay

## 2023-04-24 DIAGNOSIS — K0889 Other specified disorders of teeth and supporting structures: Secondary | ICD-10-CM | POA: Diagnosis present

## 2023-04-24 DIAGNOSIS — K029 Dental caries, unspecified: Secondary | ICD-10-CM | POA: Diagnosis not present

## 2023-04-24 MED ORDER — ONDANSETRON 4 MG PO TBDP: 4.0000 mg | ORAL_TABLET | Freq: Three times a day (TID) | ORAL | 0 refills | Status: AC | PRN

## 2023-04-24 MED ORDER — ONDANSETRON 4 MG PO TBDP
4.0000 mg | ORAL_TABLET | ORAL | Status: AC
Start: 2023-04-24 — End: 2023-04-24
  Administered 2023-04-24: 4 mg via ORAL
  Filled 2023-04-24: qty 1

## 2023-04-24 MED ORDER — OXYCODONE HCL 5 MG PO TABS
5.0000 mg | ORAL_TABLET | ORAL | Status: AC
  Administered 2023-04-24: 5 mg via ORAL
  Filled 2023-04-24: qty 1

## 2023-04-24 MED ORDER — AMOXICILLIN-POT CLAVULANATE 875-125 MG PO TABS: 1.0000 | ORAL_TABLET | Freq: Two times a day (BID) | ORAL | 0 refills | Status: DC

## 2023-04-24 MED ORDER — OXYCODONE HCL 5 MG PO TABS: 5.0000 mg | ORAL_TABLET | ORAL | 0 refills | Status: AC | PRN

## 2023-04-24 MED ORDER — AMOXICILLIN-POT CLAVULANATE 875-125 MG PO TABS
1.0000 | ORAL_TABLET | Freq: Once | ORAL | Status: AC
  Administered 2023-04-24: 1 via ORAL
  Filled 2023-04-24: qty 1

## 2023-04-24 MED ORDER — KETOROLAC TROMETHAMINE 15 MG/ML IJ SOLN
15.0000 mg | Freq: Once | INTRAMUSCULAR | Status: AC
  Administered 2023-04-24: 15 mg via INTRAMUSCULAR
  Filled 2023-04-24: qty 1

## 2023-04-24 NOTE — ED Notes (Signed)
Warm pack given for comfort.

## 2023-04-24 NOTE — ED Provider Notes (Signed)
 [place] [facility] Provider Note   CSN: [id number] Arrival date & time: 04/24/23  2124     History  Chief Complaint  Patient presents with   Dental Pain    Mary Kidd is a 43 y.o. female.  43 year old female with history of dental caries who presents emergency department for tooth infection.  Patient reports that at 2 PM today she started experiencing severe pain of her tooth.  It is in her left lower lower molar.  No difficulty swallowing.  No fever.  No voice changes.  Has had several teeth pulled before.  Says she is going to follow-up with [facility] tomorrow morning.       Home Medications Prior to Admission medications   Medication Sig Start Date End Date Taking? Authorizing Provider  oxyCODONE  (ROXICODONE ) 5 MG immediate release tablet Take 1 tablet (5 mg total) by mouth every 4 (four) hours as needed for severe pain (pain score 7-10). 04/24/23  Yes Yolande Lamar BROCKS, MD  amoxicillin -clavulanate (AUGMENTIN ) 875-125 MG tablet Take 1 tablet by mouth every 12 (twelve) hours. 04/24/23   Yolande Lamar BROCKS, MD  azithromycin  (ZITHROMAX ) 250 MG tablet Take 1 tablet (250 mg total) by mouth daily. 04/11/20   Haze Lonni PARAS, MD  fluticasone  (FLONASE ) 50 MCG/ACT nasal spray Place 1 spray into both nostrils daily. 03/08/22   Griselda Norris, MD  ibuprofen  (ADVIL ) 800 MG tablet Take 1 tablet (800 mg total) by mouth 3 (three) times daily. 10/25/22   Geiple, Joshua, PA-C  methocarbamol  (ROBAXIN ) 500 MG tablet Take 1 tablet (500 mg total) by mouth 2 (two) times daily as needed for muscle spasms. 11/11/20   Towana Ozell BROCKS, MD  mometasone  (NASONEX ) 50 MCG/ACT nasal spray Place 2 sprays into the nose daily. 03/21/20   Haze Lonni PARAS, MD  ondansetron  (ZOFRAN -ODT) 4 MG disintegrating tablet Take 1 tablet (4 mg total) by mouth every 8 (eight) hours as needed for nausea or vomiting. 04/24/23   Yolande Lamar BROCKS, MD  oxyCODONE -acetaminophen   (PERCOCET) 5-325 MG tablet Take 1 tablet by mouth every 6 (six) hours as needed. 10/23/22   Patt Alm Macho, MD  predniSONE  (DELTASONE ) 20 MG tablet Take 2 tablets (40 mg total) by mouth daily with breakfast. 04/11/20   Pollina, Lonni PARAS, MD  valACYclovir  (VALTREX ) 1000 MG tablet Take 1 tablet (1,000 mg total) by mouth 2 (two) times daily. 02/12/22   Schutt, Marsa HERO, PA-C      Allergies    Penicillins    Review of Systems   Review of Systems  Physical Exam Updated Vital Signs BP (!) 159/115 (BP Location: Right Wrist)   Pulse 77   Temp 97.8 F (36.6 C)   Resp 18   Ht 4' 7 (1.397 m)   Wt 77.6 kg   LMP 04/17/2023   SpO2 98%   BMI 39.76 kg/m  Physical Exam Vitals and nursing note reviewed.  Constitutional:      General: She is not in acute distress.    Appearance: She is well-developed.     Comments: Tolerating her secretions.  No muffled voice.  HENT:     Head: Normocephalic and atraumatic.     Right Ear: External ear normal.     Left Ear: External ear normal.     Nose: Nose normal.     Mouth/Throat:     Comments: Uvula: without deviation or edema. Uvula midline. Soft palate: without swelling Sublingual: normal appearance/no brawny edema or tongue elevation Teeth  and gums: No periapical swelling or fluctuance, no gingival swelling, no active oral bleeding.  Multiple fractured and missing teeth.  Significant tenderness palpation of tooth #20. Tonsils: no erythema or exudates  Eyes:     Extraocular Movements: Extraocular movements intact.     Conjunctiva/sclera: Conjunctivae normal.     Pupils: Pupils are equal, round, and reactive to light.  Pulmonary:     Effort: Pulmonary effort is normal. No respiratory distress.  Musculoskeletal:     Cervical back: Normal range of motion and neck supple.  Skin:    General: Skin is warm and dry.  Neurological:     Mental Status: She is alert and oriented to person, place, and time. Mental status is at baseline.   Psychiatric:        Mood and Affect: Mood normal.     ED Results / Procedures / Treatments   Labs (all labs ordered are listed, but only abnormal results are displayed) Labs Reviewed - No data to display  EKG None  Radiology No results found.  Procedures Procedures    Medications Ordered in ED Medications  oxyCODONE  (Oxy IR/ROXICODONE ) immediate release tablet 5 mg (5 mg Oral Given 04/24/23 2210)  ondansetron  (ZOFRAN -ODT) disintegrating tablet 4 mg (4 mg Oral Given 04/24/23 2207)  amoxicillin -clavulanate (AUGMENTIN ) 875-125 MG per tablet 1 tablet (1 tablet Oral Given 04/24/23 2210)  ketorolac  (TORADOL ) 15 MG/ML injection 15 mg (15 mg Intramuscular Given 04/24/23 2208)    ED Course/ Medical Decision Making/ A&P                                 Medical Decision Making Risk Prescription drug management.   Mary Kidd is a 43 y.o. female with comorbidities that complicate the patient evaluation including dental caries who presents to the emergency department with dental pain   Initial Ddx:  Dental carie, abscess, ludwigs angina   MDM/Course:  Pt presents with lower molar pain. No swelling on exam. No signs of airway compromise or ludwigs angina. Suspect pt has a dental carie. Is going to fu with dentistry tm. Discussed pain control options including nerve block but patient preferred pain medication. Given oxycodone , toradol , and augmentin  in the ED. Given short rx of augmentin  and oxycodone  to take until she follows-up with dentistry.   This patient presents to the ED for concern of complaints listed in HPI, this involves an extensive number of treatment options, and is a complaint that carries with it a high risk of complications and morbidity. Disposition including potential need for admission considered.   Dispo: DC Home. Return precautions discussed including, but not limited to, those listed in the AVS. Allowed pt time to ask questions which were answered fully prior to  dc.  Records reviewed Outpatient Clinic Notes The following labs were independently interpreted: Chemistry and show no acute abnormality I have reviewed theDental caries who presents to the emergency patients home medications and made adjustments as needed  Portions of this note were generated with Dragon dictation software. Dictation errors may occur despite best attempts at proofreading.     Final Clinical Impression(s) / ED Diagnoses Final diagnoses:  Dental caries    Rx / DC Orders ED Discharge Orders          Ordered    amoxicillin -clavulanate (AUGMENTIN ) 875-125 MG tablet  Every 12 hours        04/24/23 2218    oxyCODONE  (ROXICODONE ) 5 MG immediate  release tablet  Every 4 hours PRN        04/24/23 2218    ondansetron  (ZOFRAN -ODT) 4 MG disintegrating tablet  Every 8 hours PRN        04/24/23 2218           Dental carry, abscess, patient   Yolande Lamar BROCKS, MD 04/25/23 1332

## 2023-04-24 NOTE — Discharge Instructions (Signed)
 You were seen for your tooth infection in the emergency department.   At home, please take Tylenol  and ibuprofen  for the pain. You may also take the oxycodone  we have prescribed you for any breakthrough pain that may have.  Do not take this before driving or operating heavy machinery.  Do not take this medication with alcohol.  Take Augmentin  to prevent worsening infection.    Check your MyChart online for the results of any tests that had not resulted by the time you left the emergency department.   Follow-up with a dentist as soon as possible.  Return immediately to the emergency department if you experience any of the following: Difficulty breathing, tongue swelling, fevers, double vision, severe headache, or any other concerning symptoms.    Thank you for visiting our Emergency Department. It was a pleasure taking care of you today.

## 2023-04-24 NOTE — ED Triage Notes (Signed)
 Pt c/o dental pain that started tonight (LT lower)

## 2023-06-26 ENCOUNTER — Other Ambulatory Visit: Payer: Self-pay

## 2023-06-26 ENCOUNTER — Encounter (HOSPITAL_BASED_OUTPATIENT_CLINIC_OR_DEPARTMENT_OTHER): Payer: Self-pay | Admitting: Emergency Medicine

## 2023-06-26 ENCOUNTER — Emergency Department (HOSPITAL_BASED_OUTPATIENT_CLINIC_OR_DEPARTMENT_OTHER): Payer: Self-pay

## 2023-06-26 ENCOUNTER — Emergency Department (HOSPITAL_BASED_OUTPATIENT_CLINIC_OR_DEPARTMENT_OTHER)
Admission: EM | Admit: 2023-06-26 | Discharge: 2023-06-26 | Disposition: A | Discharge status: Home / Self Care | Payer: Self-pay | Attending: Emergency Medicine | Admitting: Emergency Medicine

## 2023-06-26 DIAGNOSIS — R109 Unspecified abdominal pain: Secondary | ICD-10-CM

## 2023-06-26 DIAGNOSIS — D72829 Elevated white blood cell count, unspecified: Secondary | ICD-10-CM | POA: Insufficient documentation

## 2023-06-26 DIAGNOSIS — F17210 Nicotine dependence, cigarettes, uncomplicated: Secondary | ICD-10-CM | POA: Insufficient documentation

## 2023-06-26 DIAGNOSIS — N838 Other noninflammatory disorders of ovary, fallopian tube and broad ligament: Secondary | ICD-10-CM | POA: Insufficient documentation

## 2023-06-26 DIAGNOSIS — J189 Pneumonia, unspecified organism: Secondary | ICD-10-CM

## 2023-06-26 DIAGNOSIS — N949 Unspecified condition associated with female genital organs and menstrual cycle: Secondary | ICD-10-CM

## 2023-06-26 LAB — PREGNANCY, URINE: Preg Test, Ur: NEGATIVE

## 2023-06-26 LAB — URINALYSIS, ROUTINE W REFLEX MICROSCOPIC
Bilirubin Urine: NEGATIVE
Glucose, UA: NEGATIVE mg/dL
Ketones, ur: NEGATIVE mg/dL
Leukocytes,Ua: NEGATIVE
Nitrite: NEGATIVE
Protein, ur: NEGATIVE mg/dL
Specific Gravity, Urine: 1.02 (ref 1.005–1.030)
pH: 6.5 (ref 5.0–8.0)

## 2023-06-26 LAB — URINALYSIS, MICROSCOPIC (REFLEX)

## 2023-06-26 LAB — COMPREHENSIVE METABOLIC PANEL WITH GFR
ALT: 15 U/L (ref 0–44)
AST: 21 U/L (ref 15–41)
Albumin: 3.7 g/dL (ref 3.5–5.0)
Alkaline Phosphatase: 57 U/L (ref 38–126)
Anion gap: 9 (ref 5–15)
BUN: 9 mg/dL (ref 6–20)
CO2: 23 mmol/L (ref 22–32)
Calcium: 9 mg/dL (ref 8.9–10.3)
Chloride: 103 mmol/L (ref 98–111)
Creatinine, Ser: 0.82 mg/dL (ref 0.44–1.00)
GFR, Estimated: >60 mL/min (ref >60)
Glucose, Bld: 132 mg/dL — ABNORMAL HIGH (ref 70–99)
Potassium: 3.8 mmol/L (ref 3.5–5.1)
Sodium: 135 mmol/L (ref 135–145)
Total Bilirubin: 0.7 mg/dL (ref 0.0–1.2)
Total Protein: 7.3 g/dL (ref 6.5–8.1)

## 2023-06-26 LAB — CBC WITH DIFFERENTIAL/PLATELET
Abs Immature Granulocytes: 0.04 10*3/uL (ref 0.00–0.07)
Basophils Absolute: 0 10*3/uL (ref 0.0–0.1)
Basophils Relative: 0 %
Eosinophils Absolute: 0.1 10*3/uL (ref 0.0–0.5)
Eosinophils Relative: 1 %
HCT: 39.6 % (ref 36.0–46.0)
Hemoglobin: 13.1 g/dL (ref 12.0–15.0)
Immature Granulocytes: 0 %
Lymphocytes Relative: 26 %
Lymphs Abs: 3.6 10*3/uL (ref 0.7–4.0)
MCH: 31.2 pg (ref 26.0–34.0)
MCHC: 33.1 g/dL (ref 30.0–36.0)
MCV: 94.3 fL (ref 80.0–100.0)
Monocytes Absolute: 0.9 10*3/uL (ref 0.1–1.0)
Monocytes Relative: 6 %
Neutro Abs: 9.4 10*3/uL — ABNORMAL HIGH (ref 1.7–7.7)
Neutrophils Relative %: 67 %
Platelets: 483 10*3/uL — ABNORMAL HIGH (ref 150–400)
RBC: 4.2 MIL/uL (ref 3.87–5.11)
RDW: 14.6 % (ref 11.5–15.5)
WBC: 14 10*3/uL — ABNORMAL HIGH (ref 4.0–10.5)
nRBC: 0 % (ref 0.0–0.2)

## 2023-06-26 LAB — LIPASE, BLOOD: Lipase: 32 U/L (ref 11–51)

## 2023-06-26 MED ORDER — FLUCONAZOLE 150 MG PO TABS: 150.0000 mg | ORAL_TABLET | Freq: Every day | ORAL | 0 refills | Status: AC

## 2023-06-26 MED ORDER — AMOXICILLIN-POT CLAVULANATE 875-125 MG PO TABS: 1.0000 | ORAL_TABLET | Freq: Two times a day (BID) | ORAL | 0 refills | Status: AC

## 2023-06-26 MED ORDER — DOXYCYCLINE HYCLATE 100 MG PO CAPS: 100.0000 mg | ORAL_CAPSULE | Freq: Two times a day (BID) | ORAL | 0 refills | Status: AC

## 2023-06-26 NOTE — ED Triage Notes (Signed)
 Intermittent left flank pain since 06/22/23.  No dysuria.  No known fever.

## 2023-06-26 NOTE — Discharge Instructions (Addendum)
 At this time there does not appear to be the presence of an emergent medical condition, however there is always the potential for conditions to change. Please read and follow the below instructions.  Please return to the Emergency Department immediately for any new or worsening symptoms or if your symptoms do not improve within 3 days. Please be sure to follow up with your Primary Care Provider within one week regarding your visit today; please call their office to schedule an appointment even if you are feeling better for a follow-up visit. Your CT scan showed some evidence of infection with in your right middle and left lower lobe.  Please take the antibiotics doxycycline and Augmentin to help treat this infection.  Please follow-up with your primary care provider for recheck in the next week to ensure improvement.  Return immediately to the emergency department for any new or worsening symptoms. Your CT scan also showed a 4.3 x 3.6 cm cyst of the right adnexa.  Please discuss this with your PCP or OB/GYN in the next 1 to 2 weeks. Your CT scan also showed aortic atherosclerosis.  Please follow-up with your primary care provider for that. Please try to stop smoking.   Please read the additional information packets attached to your discharge summary.  Go to the nearest Emergency Department immediately if: You have fever or chills You have shortness of breath You have chest pain You pass out You cough up blood You have abdominal pain, nausea or vomiting You have any new/concerning or worsening of symptoms  Do not take your medicine if  develop an itchy rash, swelling in your mouth or lips, or difficulty breathing; call 911 and seek immediate emergency medical attention if this occurs.  You may review your lab tests and imaging results in their entirety on your MyChart account.  Please discuss all results of fully with your primary care provider and other specialist at your follow-up  visit.  Note: Portions of this text may have been transcribed using voice recognition software. Every effort was made to ensure accuracy; however, inadvertent computerized transcription errors may still be present.

## 2023-06-26 NOTE — ED Provider Notes (Cosign Needed Addendum)
 [place] [facility] Provider Note   CSN: [id number] Arrival date & time: 06/26/23  1037     History  Chief Complaint  Patient presents with   Flank Pain    Mary Kidd is a 43 y.o. female daily smoker otherwise healthy presents today medication use presents with her husband Mary Kidd for evaluation of left flank pain.  Symptom onset 3 days ago.  No clear inciting event.  She denies any lifting or pulling to precipitate the pain, no fall or injury.  She describes a sharp pain to her left flank which will radiate around the left upper abdomen.  Patient reports pain will come at random she cannot identify any clear aggravating or alleviating factors.  She denies any ration of pain down the leg.  She denies numbness, tingling, saddle area paresthesias, bowel/bladder incontinence, urinary retention, dysuria/hematuria, abnormal vaginal bleeding/discharge, fever, nausea or vomiting, diarrhea, chest pain/shortness of breath or any additional concerns.  Patient denies birth control use, hemoptysis, recent long distance travel/immobilization, recent surgeries, history of cancer, history of PE/DVT, extremity swelling/color change.  Of note patient does report that she is currently on her menstrual cycle.  HPI     Home Medications Prior to Admission medications   Medication Sig Start Date End Date Taking? Authorizing Provider  amoxicillin-clavulanate (AUGMENTIN) 875-125 MG tablet Take 1 tablet by mouth every 12 (twelve) hours for 5 days. 06/26/23 07/01/23 Yes Hadassah Letters A, PA-C  doxycycline (VIBRAMYCIN) 100 MG capsule Take 1 capsule (100 mg total) by mouth 2 (two) times daily for 5 days. 06/26/23 07/01/23 Yes Hadassah Letters A, PA-C  fluconazole (DIFLUCAN) 150 MG tablet Take 1 tablet (150 mg total) by mouth daily for 1 day. 06/26/23 06/27/23 Yes Hadassah Letters A, PA-C  azithromycin (ZITHROMAX) 250 MG tablet Take 1 tablet (250 mg total) by mouth daily.  04/11/20   Ballard Bongo, MD  fluticasone (FLONASE) 50 MCG/ACT nasal spray Place 1 spray into both nostrils daily. 03/08/22   Kelsey Patricia, MD  ibuprofen (ADVIL) 800 MG tablet Take 1 tablet (800 mg total) by mouth 3 (three) times daily. 10/25/22   Lyna Sandhoff, PA-C  methocarbamol (ROBAXIN) 500 MG tablet Take 1 tablet (500 mg total) by mouth 2 (two) times daily as needed for muscle spasms. 11/11/20   Butler, Michael C, MD  mometasone (NASONEX) 50 MCG/ACT nasal spray Place 2 sprays into the nose daily. 03/21/20   Ballard Bongo, MD  ondansetron (ZOFRAN-ODT) 4 MG disintegrating tablet Take 1 tablet (4 mg total) by mouth every 8 (eight) hours as needed for nausea or vomiting. 04/24/23   Ninetta Basket, MD  oxyCODONE (ROXICODONE) 5 MG immediate release tablet Take 1 tablet (5 mg total) by mouth every 4 (four) hours as needed for severe pain (pain score 7-10). 04/24/23   Ninetta Basket, MD  oxyCODONE-acetaminophen (PERCOCET) 5-325 MG tablet Take 1 tablet by mouth every 6 (six) hours as needed. 10/23/22   Dalene Duck, MD  predniSONE (DELTASONE) 20 MG tablet Take 2 tablets (40 mg total) by mouth daily with breakfast. 04/11/20   Pollina, Marine Sia, MD  valACYclovir (VALTREX) 1000 MG tablet Take 1 tablet (1,000 mg total) by mouth 2 (two) times daily. 02/12/22   Schutt, Coni Deep, PA-C      Allergies    Penicillins    Review of Systems   Review of Systems Ten systems are reviewed and are negative for acute change except as noted in the HPI  Physical  Exam Updated Vital Signs BP (!) 148/99 (BP Location: Right Arm)   Pulse 79   Temp 98.5 F (36.9 C) (Oral)   Resp 18   Ht 4\' 7"  (1.397 m)   Wt 75.3 kg   LMP 06/26/2023   SpO2 98%   BMI 38.58 kg/m  Physical Exam Constitutional:      General: She is not in acute distress.    Appearance: Normal appearance. She is well-developed. She is not ill-appearing or diaphoretic.  HENT:     Head: Normocephalic and atraumatic.   Eyes:     General: Vision grossly intact. Gaze aligned appropriately.     Pupils: Pupils are equal, round, and reactive to light.  Neck:     Trachea: Trachea and phonation normal.  Pulmonary:     Effort: Pulmonary effort is normal. No accessory muscle usage or respiratory distress.     Breath sounds: Normal breath sounds and air entry.  Chest:     Chest wall: No tenderness.  Abdominal:     General: There is no distension.     Palpations: Abdomen is soft.     Tenderness: There is no abdominal tenderness. There is no guarding or rebound.  Musculoskeletal:        General: Normal range of motion.     Cervical back: Normal range of motion.     Comments: No midline spinal tenderness to palpation.  No crepitus step-off deformity of the spine.  No paraspinal muscular tenderness or SI joint tenderness.  No SI joint tenderness.  Abdomen soft nontender.  Full motion of the lumbar spine without significant pain.  Negative straight leg raise bilaterally.  Sensation intact and equal in all distributions of bilateral lower extremities.  5/5 strength with bilateral EHL, dorsi/plantarflexion, knee flexion/extension and hip flexion.  Negative clonus of the feet bilaterally.  Strong equal pedal pulses.  Compartment soft.  No swelling of either lower extremity.  Skin:    General: Skin is warm and dry.  Neurological:     Mental Status: She is alert.     GCS: GCS eye subscore is 4. GCS verbal subscore is 5. GCS motor subscore is 6.     Comments: Speech is clear and goal oriented, follows commands Major Cranial nerves without deficit, no facial droop Moves extremities without ataxia, coordination intact  Psychiatric:        Behavior: Behavior normal.     ED Results / Procedures / Treatments   Labs (all labs ordered are listed, but only abnormal results are displayed) Labs Reviewed  CBC WITH DIFFERENTIAL/PLATELET - Abnormal; Notable for the following components:      Result Value   WBC 14.0 (*)     Platelets 483 (*)    Neutro Abs 9.4 (*)    All other components within normal limits  COMPREHENSIVE METABOLIC PANEL WITH GFR - Abnormal; Notable for the following components:   Glucose, Bld 132 (*)    All other components within normal limits  URINALYSIS, ROUTINE W REFLEX MICROSCOPIC - Abnormal; Notable for the following components:   Hgb urine dipstick MODERATE (*)    All other components within normal limits  URINALYSIS, MICROSCOPIC (REFLEX) - Abnormal; Notable for the following components:   Bacteria, UA RARE (*)    All other components within normal limits  LIPASE, BLOOD  PREGNANCY, URINE    EKG None  Radiology CT Renal Stone Study Result Date: 06/26/2023 CLINICAL DATA:  Left flank pain.  Kidney stones suspected. EXAM: CT ABDOMEN AND PELVIS  WITHOUT CONTRAST TECHNIQUE: Multidetector CT imaging of the abdomen and pelvis was performed following the standard protocol without IV contrast. RADIATION DOSE REDUCTION: This exam was performed according to the departmental dose-optimization program which includes automated exposure control, adjustment of the mA and/or kV according to patient size and/or use of iterative reconstruction technique. COMPARISON:  10/09/2007 FINDINGS: Lower chest: Clustered ground-glass nodules identified left lower lobe measure up to 11 mm with a single small ground-glass nodule in the right middle lobe. Hepatobiliary: No suspicious focal abnormality in the liver on this study without intravenous contrast. There is no evidence for gallstones, gallbladder wall thickening, or pericholecystic fluid. No intrahepatic or extrahepatic biliary dilation. Pancreas: No focal mass lesion. No dilatation of the main duct. No intraparenchymal cyst. No peripancreatic edema. Spleen: No splenomegaly. No suspicious focal mass lesion. Adrenals/Urinary Tract: Right adrenal gland unremarkable. Dystrophic calcification in the left adrenal parenchyma is stable without a nodule or mass. Kidneys  unremarkable. No evidence for hydroureter. The urinary bladder appears normal for the degree of distention. Stomach/Bowel: Stomach is unremarkable. No gastric wall thickening. No evidence of outlet obstruction. Duodenum is normally positioned as is the ligament of Treitz. No small bowel wall thickening. No small bowel dilatation. The terminal ileum is normal. The appendix is normal. No gross colonic mass. No colonic wall thickening. Vascular/Lymphatic: There is mild atherosclerotic calcification of the abdominal aorta without aneurysm. There is no gastrohepatic or hepatoduodenal ligament lymphadenopathy. No retroperitoneal or mesenteric lymphadenopathy. No pelvic sidewall lymphadenopathy. Reproductive: The uterus is unremarkable. 4.3 x 3.6 cm simple appearing cyst is identified in the posterior right adnexal space/cul-de-sac. No left adnexal mass. Other: No intraperitoneal free fluid. Musculoskeletal: No worrisome lytic or sclerotic osseous abnormality. IMPRESSION: 1. No acute findings in the abdomen or pelvis. No findings to explain the patient's history of left flank pain. Specifically, no evidence for nephrolithiasis or hydronephrosis. 2. 4.3 x 3.6 cm simple appearing cyst in the posterior right adnexal space/cul-de-sac. Given patient age, no specific imaging follow-up recommended in an asymptomatic patient. This recommendation follows ACR consensus guidelines: White Paper of the ACR Incidental Findings Committee II on Adnexal Findings. J Am Coll Radiol 2013:10:675-681. 3. Clustered ground-glass nodules in the left lower lobe measure up to 11 mm with a single small ground-glass nodule in the right middle lobe. Imaging features are most suggestive of an infectious/inflammatory process. 4. Aortic atherosclerosis. Electronically Signed   By: Donnal Fusi M.D.   On: 06/26/2023 12:49     Procedures Procedures    Medications Ordered in ED Medications - No data to display  ED Course/ Medical Decision Making/  A&P Clinical Course as of 06/26/23 1514  Sun Jun 26, 2023  1143 Urinalysis, Routine w reflex microscopic -Urine, Clean Catch(!) Hemoglobin present, patient is on her menstrual cycle. [BM]  1144 Urinalysis, Microscopic (reflex)(!) Rare bacteria, no WBCs.  No suspicion for UTI given no nitrates or leukocytes. [BM]  1144 CBC with Differential(!) Leukocytosis of 14.0.  Vitals within normal limits.  Does not meet sepsis criteria. [BM]  1448 Comprehensive metabolic panel(!) No AKI, LFT elevation or gap.  No emergent electrolyte abnormality. [BM]  1448 Lipase, blood Lipase within normal limits, doubt pancreatitis [BM]  1449 Pregnancy, urine Negative pregnancy test.  Doubt ectopic [BM]  1449 CT Renal Stone Study I reviewed patient CT renal stone study.  Etiology is reports no kidney stone.  Patient does have adnexal cyst.  Additionally groundglass nodules within the left lower lobe and right middle lobes suggesting infectious or inflammation.  I agree with radiologist interpretation. [BM]    Clinical Course User Index [BM] Jennifer Moellers, PA-C                                 Medical Decision Making 43 year old female history smoking reports her self otherwise healthy no known medication use presents for evaluation of left flank pain.   Differential diagnosis for patient's chief complaint includes but not limited to kidney stone, gastritis, pancreatitis, cholecystitis, diverticulitis, PNA.  Plan to obtain CBC, CMP, lipase, UA, urinary pregnancy and CT renal stone study.  Patient in agreement with plan of care.  Patient reports that she has had intermittent left flank pain x 4 days, no clear inciting event she reports pain will come on at random and occasionally radiate to her left upper abdomen.  Patient cannot identify any clear aggravating or alleviating factors  Amount and/or Complexity of Data Reviewed Labs: ordered. Decision-making details documented in ED Course. Radiology:  ordered. Decision-making details documented in ED Course.  Risk Prescription drug management. Risk Details: Patient reassessed multiple times, she is well-appearing and in no acute distress.  I reviewed patient's labs and CT imaging report with her and her husband and they stated understanding.  Suspect patient's left flank pain may be due to groundglass inflammation seen on CT possibly community-acquired pneumonia.  Patient then reported that she has been visiting her mother who is currently in the hospital with pneumonia.  Low suspicion for PE as cause of her symptoms today, she is low risk by Wells criteria and PERC negative.  She denies any chest pain or shortness of breath.  Patient report cough due to smoking but denies any change to her cough.  Her vital signs are stable on room air.  Patient is a smoker so plan to cover with both doxycycline and Augmentin today.  Case, workup and plan discussed w/ Dr. Colonel Dears who agrees with antibiotics and discharge.     At this time there does not appear to be any evidence of an acute emergency medical condition and the patient appears stable for discharge with appropriate outpatient follow up. Diagnosis was discussed with patient who verbalizes understanding of care plan and is agreeable to discharge. I have discussed return precautions with patient and her husband who verbalizes understanding. Patient encouraged to follow-up with their PCP. All questions answered. ------------------------------------------- Addendum 3:13 PM: Patient reports that she gets vaginal yeast infections with Augmentin.  She requested Diflucan which I had not sent in earlier.  I called patient and confirmed demographic information and sent her in the diclofenac to use if she develops yeast infection.  She denies any rash, swelling or difficulty breathing, no allergy to penicillins.     Note: Portions of this report may have been transcribed using voice recognition software. Every  effort was made to ensure accuracy; however, inadvertent computerized transcription errors may still be present.         Final Clinical Impression(s) / ED Diagnoses Final diagnoses:  Left flank pain  Pneumonia due to infectious organism, unspecified laterality, unspecified part of lung  Adnexal cyst    Rx / DC Orders ED Discharge Orders          Ordered    doxycycline (VIBRAMYCIN) 100 MG capsule  2 times daily        06/26/23 1430    amoxicillin-clavulanate (AUGMENTIN) 875-125 MG tablet  Every 12 hours  06/26/23 1430    fluconazole (DIFLUCAN) 150 MG tablet  Daily        06/26/23 1513              Valarie Garner 06/26/23 1457    Valarie Garner 06/26/23 1514    Lowery Rue, DO 06/27/23 [phone]
